# Patient Record
Sex: Male | Born: 1960 | Race: White | Hispanic: No | Marital: Married | Smoking: Never smoker
Health system: Southern US, Community
[De-identification: ages and names within clinical notes are randomized; demographics above are authoritative.]

## PROBLEM LIST (undated history)

## (undated) DIAGNOSIS — E785 Hyperlipidemia, unspecified: Secondary | ICD-10-CM

## (undated) DIAGNOSIS — M722 Plantar fascial fibromatosis: Secondary | ICD-10-CM

## (undated) DIAGNOSIS — R319 Hematuria, unspecified: Secondary | ICD-10-CM

## (undated) DIAGNOSIS — D509 Iron deficiency anemia, unspecified: Secondary | ICD-10-CM

## (undated) DIAGNOSIS — K219 Gastro-esophageal reflux disease without esophagitis: Secondary | ICD-10-CM

## (undated) DIAGNOSIS — K644 Residual hemorrhoidal skin tags: Secondary | ICD-10-CM

## (undated) DIAGNOSIS — M545 Low back pain, unspecified: Secondary | ICD-10-CM

## (undated) DIAGNOSIS — E119 Type 2 diabetes mellitus without complications: Secondary | ICD-10-CM

## (undated) DIAGNOSIS — G4730 Sleep apnea, unspecified: Secondary | ICD-10-CM

## (undated) DIAGNOSIS — R7989 Other specified abnormal findings of blood chemistry: Secondary | ICD-10-CM

## (undated) DIAGNOSIS — G629 Polyneuropathy, unspecified: Secondary | ICD-10-CM

## (undated) DIAGNOSIS — R0789 Other chest pain: Secondary | ICD-10-CM

## (undated) DIAGNOSIS — R5383 Other fatigue: Secondary | ICD-10-CM

## (undated) DIAGNOSIS — I77819 Aortic ectasia, unspecified site: Secondary | ICD-10-CM

## (undated) DIAGNOSIS — I10 Essential (primary) hypertension: Secondary | ICD-10-CM

## (undated) DIAGNOSIS — R748 Abnormal levels of other serum enzymes: Secondary | ICD-10-CM

## (undated) HISTORY — DX: Low back pain, unspecified: M54.50

## (undated) HISTORY — DX: Polyneuropathy, unspecified: G62.9

## (undated) HISTORY — DX: Plantar fascial fibromatosis: M72.2

## (undated) HISTORY — DX: Essential (primary) hypertension: I10

## (undated) HISTORY — DX: Hyperlipidemia, unspecified: E78.5

## (undated) HISTORY — DX: Other chest pain: R07.89

## (undated) HISTORY — DX: Residual hemorrhoidal skin tags: K64.4

## (undated) HISTORY — DX: Other specified abnormal findings of blood chemistry: R79.89

## (undated) HISTORY — DX: Abnormal levels of other serum enzymes: R74.8

## (undated) HISTORY — DX: Hematuria, unspecified: R31.9

## (undated) HISTORY — PX: WISDOM TOOTH EXTRACTION: SHX21

## (undated) HISTORY — DX: Type 2 diabetes mellitus without complications: E11.9

## (undated) HISTORY — DX: Aortic ectasia, unspecified site: I77.819

## (undated) HISTORY — DX: Low back pain: M54.5

## (undated) HISTORY — DX: Other fatigue: R53.83

## (undated) HISTORY — DX: Gastro-esophageal reflux disease without esophagitis: K21.9

## (undated) HISTORY — DX: Sleep apnea, unspecified: G47.30

## (undated) HISTORY — DX: Iron deficiency anemia, unspecified: D50.9

---

## 2000-05-13 ENCOUNTER — Encounter: Admission: RE | Admit: 2000-05-13 | Discharge: 2000-05-13 | Payer: Self-pay | Admitting: Internal Medicine

## 2000-05-13 ENCOUNTER — Encounter: Payer: Self-pay | Admitting: Internal Medicine

## 2001-06-09 ENCOUNTER — Ambulatory Visit (HOSPITAL_COMMUNITY): Admission: RE | Admit: 2001-06-09 | Discharge: 2001-06-09 | Payer: Self-pay | Admitting: Internal Medicine

## 2004-03-25 ENCOUNTER — Encounter: Admission: RE | Admit: 2004-03-25 | Discharge: 2004-03-25 | Payer: Self-pay | Admitting: Emergency Medicine

## 2004-07-16 ENCOUNTER — Encounter: Admission: RE | Admit: 2004-07-16 | Discharge: 2004-07-16 | Payer: Self-pay | Admitting: Emergency Medicine

## 2005-12-15 ENCOUNTER — Encounter: Admission: RE | Admit: 2005-12-15 | Discharge: 2005-12-15 | Payer: Self-pay | Admitting: Emergency Medicine

## 2015-02-20 ENCOUNTER — Encounter: Payer: Self-pay | Admitting: *Deleted

## 2015-07-02 ENCOUNTER — Institutional Professional Consult (permissible substitution): Payer: Federal, State, Local not specified - PPO | Admitting: Neurology

## 2015-07-15 ENCOUNTER — Ambulatory Visit (INDEPENDENT_AMBULATORY_CARE_PROVIDER_SITE_OTHER): Payer: Federal, State, Local not specified - PPO | Admitting: Neurology

## 2015-07-15 ENCOUNTER — Encounter (INDEPENDENT_AMBULATORY_CARE_PROVIDER_SITE_OTHER): Payer: Self-pay

## 2015-07-15 ENCOUNTER — Encounter: Payer: Self-pay | Admitting: Neurology

## 2015-07-15 VITALS — BP 142/88 | HR 88 | Resp 18 | Ht 72.0 in | Wt 192.0 lb

## 2015-07-15 DIAGNOSIS — E663 Overweight: Secondary | ICD-10-CM | POA: Diagnosis not present

## 2015-07-15 DIAGNOSIS — G4719 Other hypersomnia: Secondary | ICD-10-CM | POA: Diagnosis not present

## 2015-07-15 DIAGNOSIS — G2581 Restless legs syndrome: Secondary | ICD-10-CM | POA: Diagnosis not present

## 2015-07-15 DIAGNOSIS — R0683 Snoring: Secondary | ICD-10-CM | POA: Diagnosis not present

## 2015-07-15 NOTE — Patient Instructions (Signed)

## 2015-07-15 NOTE — Progress Notes (Signed)
Subjective:    Patient ID: Stephen Peck is a 54 y.o. male.  HPI     Huston Foley, MD, PhD [facility] [address] [address] [place], [place] [address]  Dear Dr. Felipa Eth,   I saw your patient, Stephen Peck, upon your kind request in my neurologic clinic today for initial consultation of his sleep disorder, in particular, concern for underlying obstructive sleep apnea. The patient is unaccompanied today. As you know, Stephen Peck is a 53 year old right-handed gentleman with an underlying medical history of hyperlipidemia, reflux disease, hypertension, atypical chest pain, low back pain, peripheral neuropathy and overweight state, who reports snoring, witnessed apneas per wife and excessive daytime somnolence. He has had infrequent choking sensations while asleep. His Epworth sleepiness score is 11 out of 24 today, his fatigue score is 30 out of 63.in the recent past, he has had some muscle aching and joint pains in particular knee pain bilaterally which was attributed to being on Crestor. About a month ago he was switched to low-dose pravastatin. Symptoms of aching and joint pains improved but he still has aching in both forearms close to the elbow region bilaterally. He denies any symptoms consistent with carpal tunnel syndrome. He denies morning headaches. He has occasional restless leg symptoms and has to move his legs at night. He is overall a fairly restless and light sleeper. Bedtime varies from 10 PM to midnight. He falls asleep fairly quickly. He has multiple nighttime awakenings but does not stay awake long. He has a rise time of 6:30 on weekdays and may sleep until 7:30 on weekends. He wakes up marginally to adequately rested. He has no significant nocturia. He is not aware of any family history of OSA. His weight has been fairly stable in the past 5-6 years. He lives at home with his wife and 2 children, ages 74 and 74. He works full-time as a  Surveyor, minerals for [employer]. He drinks alcohol socially, he does not smoke, he drinks caffeine in the form of coffee.  I reviewed your office note from 06/09/2015, which you kindly included.   His Past Medical History Is Significant For: Past Medical History  Diagnosis Date  . GERD (gastroesophageal reflux disease)   . Peripheral neuropathy (HCC)   . Hypertension   . Dyslipidemia   . Lumbar back pain   . Elevated liver enzymes   . Hyperlipemia   . Fatigue     His Past Surgical History Is Significant For: Past Surgical History  Procedure Laterality Date  . Wisdom tooth extraction      His Family History Is Significant For: Family History  Problem Relation Age of Onset  . Stroke Mother   . Hypertension Mother   . Cancer Father   . Cancer Maternal Grandmother   . Cancer Paternal Grandfather   . Stroke Paternal Grandmother   . Diabetes Brother   . Diabetes Brother     His Social History Is Significant For: Social History   Social History  . Marital Status: Married    Spouse Name: N/A  . Number of Children: 2  . Years of Education: College   Occupational History  . MBA [employer]    Social History Main Topics  . Smoking status: Never Smoker   . Smokeless tobacco: None  . Alcohol Use: 1.8 - 2.4 oz/week    3-4 Standard drinks or equivalent per week  . Drug Use: No  . Sexual Activity: Not Asked   Other  Topics Concern  . None   Social History Narrative   Drinks coffee daily     His Allergies Are:  No Known Allergies:   His Current Medications Are:  Outpatient Encounter Prescriptions as of 07/15/2015  Medication Sig  . omeprazole (PRILOSEC) 20 MG capsule Take 20 mg by mouth daily.  . pravastatin (PRAVACHOL) 20 MG tablet   . telmisartan (MICARDIS) 80 MG tablet Take 80 mg by mouth daily.   No facility-administered encounter medications on file as of 07/15/2015.  :  Review of Systems:  Out of a complete 14 point review of systems, all are reviewed and  negative with the exception of these symptoms as listed below:   Review of Systems  Musculoskeletal:       Joint pain, aching muscles   Neurological:       Patient reports that he wakes up frequently during the night, snoring, witnessed apnea, has awaken gasping for air when sitting in chair, restless legs, sometimes wakes up feeling tired, daytime tiredness after lunch, h/o migraines, denies taking naps.    Epworth Sleepiness Scale 0= would never doze 1= slight chance of dozing 2= moderate chance of dozing 3= high chance of dozing  Sitting and reading:2 Watching TV:2 Sitting inactive in a public place (ex. Theater or meeting):1 As a passenger in a car for an hour without a break:1 Lying down to rest in the afternoon:2 Sitting and talking to someone:1 Sitting quietly after lunch (no alcohol):2 In a car, while stopped in traffic:0 Total:11 Objective:  Neurologic Exam  Physical Exam Physical Examination:   Filed Vitals:   07/15/15 1437  BP: 142/88  Pulse: 88  Resp: 18   General Examination: The patient is a very pleasant 54 y.o. male in no acute distress. He appears well-developed and well-nourished and well groomed.   HEENT: Normocephalic, atraumatic, pupils are equal, round and reactive to light and accommodation. Funduscopic exam is normal with sharp disc margins noted. Extraocular tracking is good without limitation to gaze excursion or nystagmus noted. Normal smooth pursuit is noted. Hearing is grossly intact. Face is symmetric with normal facial animation and normal facial sensation. Speech is clear with no dysarthria noted. There is no hypophonia. There is no lip, neck/head, jaw or voice tremor. Neck is supple with full range of passive and active motion. There are no carotid bruits on auscultation. Oropharynx exam reveals: mild mouth dryness, good dental hygiene and mild airway crowding, due to thicker tongue, redundant soft palate. Tonsils are small, uvula is small.  Mallampati is class II. Tongue protrudes centrally and palate elevates symmetrically. Tonsils are 1+ in size. Neck size is 17 inches. He has a minimal overbite. Nasal inspection reveals no significant nasal mucosal bogginess or redness and no septal deviation.   Chest: Clear to auscultation without wheezing, rhonchi or crackles noted.  Heart: S1+S2+0, regular and normal without murmurs, rubs or gallops noted.   Abdomen: Soft, non-tender and non-distended with normal bowel sounds appreciated on auscultation.  Extremities: There is no pitting edema in the distal lower extremities bilaterally. Pedal pulses are intact.  Skin: Warm and dry without trophic changes noted. There are no varicose veins.  Musculoskeletal: exam reveals no obvious joint deformities, tenderness or joint swelling or erythema.   Neurologically:  Mental status: The patient is awake, alert and oriented in all 4 spheres. His immediate and remote memory, attention, language skills and fund of knowledge are appropriate. There is no evidence of aphasia, agnosia, apraxia or anomia. Speech is clear  with normal prosody and enunciation. Thought process is linear. Mood is normal and affect is normal.  Cranial nerves II - XII are as described above under HEENT exam. In addition: shoulder shrug is normal with equal shoulder height noted. Motor exam: Normal bulk, strength and tone is noted. There is no drift, tremor or rebound. Romberg is negative. Reflexes are 2+ throughout. Babinski: Toes are flexor bilaterally. Fine motor skills and coordination: intact with normal finger taps, normal hand movements, normal rapid alternating patting, normal foot taps and normal foot agility.  Cerebellar testing: No dysmetria or intention tremor on finger to nose testing. Heel to shin is unremarkable bilaterally. There is no truncal or gait ataxia.  Sensory exam: intact in the upper and lower extremities.  Gait, station and balance: He stands easily. No  veering to one side is noted. No leaning to one side is noted. Posture is age-appropriate and stance is narrow based. Gait shows normal stride length and normal pace. No problems turning are noted. He turns en bloc. Tandem walk is unremarkable.                Assessment and Plan:  In summary, Stephen Peck is a very pleasant 63 y.o.-year old male with an underlying medical history of hyperlipidemia, reflux disease, hypertension, atypical chest pain, low back pain, peripheral neuropathy and overweight state, whose history and physical exam are indeed concerning for obstructive sleep apnea (OSA). He also endorses mild restless leg symptoms. I had a long chat with the patient about my findings and the diagnosis of OSA, its prognosis and treatment options. We talked about medical treatments, surgical interventions and non-pharmacological approaches. I explained in particular the risks and ramifications of untreated moderate to severe OSA, especially with respect to developing cardiovascular disease down the Road, including congestive heart failure, difficult to treat hypertension, cardiac arrhythmias, or stroke. Even type 2 diabetes has, in part, been linked to untreated OSA. Symptoms of untreated OSA include daytime sleepiness, memory problems, mood irritability and mood disorder such as depression and anxiety, lack of energy, as well as recurrent headaches, especially morning headaches. We talked about trying to maintain a healthy lifestyle in general, as well as the importance of weight control. I encouraged the patient to eat healthy, exercise daily and keep well hydrated, to keep a scheduled bedtime and wake time routine, to not skip any meals and eat healthy snacks in between meals. I advised the patient not to drive when feeling sleepy. I recommended the following at this time: sleep study with potential positive airway pressure titration. (We will score hypopneas at 3% and split the sleep study into  diagnostic and treatment portion, if the estimated. 2 hour AHI is >15/h).   I explained the sleep test procedure to the patient and also outlined possible surgical and non-surgical treatment options of OSA, including the use of a custom-made dental device (which would require a referral to a specialist dentist or oral surgeon), upper airway surgical options, such as pillar implants, radiofrequency surgery, tongue base surgery, and UPPP (which would involve a referral to an ENT surgeon). Rarely, jaw surgery such as mandibular advancement may be considered.  I also explained the CPAP treatment option to the patient, who indicated that he would be reluctant to use CPAP but would be willing to try it if the need arises and he qualifies. I explained the importance of being compliant with PAP treatment, not only for insurance purposes but primarily to improve His symptoms, and for the  patient's long term health benefit, including to reduce His cardiovascular risks. I answered all his questions today and the patient was in agreement. I would like to see him back after the sleep study is completed and encouraged him to call with any interim questions, concerns, problems or updates.   Thank you very much for allowing me to participate in the care of this nice patient. If I can be of any further assistance to you please do not hesitate to call me at [phone].  Sincerely,   Huston Foley, MD, PhD

## 2016-01-06 DIAGNOSIS — K080 Exfoliation of teeth due to systemic causes: Secondary | ICD-10-CM | POA: Diagnosis not present

## 2016-01-30 ENCOUNTER — Ambulatory Visit (INDEPENDENT_AMBULATORY_CARE_PROVIDER_SITE_OTHER): Payer: Federal, State, Local not specified - PPO | Admitting: Neurology

## 2016-01-30 DIAGNOSIS — G4733 Obstructive sleep apnea (adult) (pediatric): Secondary | ICD-10-CM | POA: Diagnosis not present

## 2016-02-06 ENCOUNTER — Telehealth: Payer: Self-pay | Admitting: Neurology

## 2016-02-06 DIAGNOSIS — G4733 Obstructive sleep apnea (adult) (pediatric): Secondary | ICD-10-CM

## 2016-02-06 NOTE — Telephone Encounter (Signed)
Diana:  Patient referred by Dr. Felipa EthAvva, seen by me on 07/14/16, split study on 01/30/16, Ins: [employer]. Please call and notify patient that the recent sleep study confirmed the diagnosis of moderate OSA. He did well with CPAP during the study with significant improvement of the respiratory events. Therefore, I would like start the patient on CPAP therapy at home by prescribing a machine for home use. I placed the order in the chart. The patient will need a follow up appointment with me in 8 to 10 weeks post set up that has to be scheduled; please go ahead and schedule while you have the patient on the phone and make sure patient understands the importance of keeping this window for the FU appointment, as it is often an insurance requirement and failing to adhere to this may result in losing coverage for sleep apnea treatment.  Please re-enforce the importance of compliance with treatment and the need for us to monitor compliance data - again an insurance requirement and good feedback for the patient as far as how they are doing.  Also remind patient, that any upcoming CPAP machine or mask issues, should be first addressed with the DME company. Please ask if patient has a preference regarding DME company.  Please arrange for CPAP set up at home through a DME company of patient's choice - once you have spoken to the patient - and faxed/routed report to PCP and referring MD (if other than PCP), you can close this encounter, thanks,   Huston FoleySaima Tanaysia Bhardwaj, MD, PhD [facility] ([facility]

## 2016-02-09 NOTE — Telephone Encounter (Signed)
I spoke to patient, he is aware of results and recommendations. He is not apposed to CPAP treatment. He wants to know with his degree of OSA, is there other options for him i.e weight loss, dental device etc. Patient is aware that I will call him back tomorrow with further.

## 2016-02-10 NOTE — Telephone Encounter (Addendum)
Pls call him back: I would like to encourage him to try CPAP for a few months at least. He is not obese, so weight loss may help, but may not eliminate his OSA. Oral appliance may be an option down the road. Let's discuss when he comes back for his 1st cpap FU.

## 2016-02-10 NOTE — Telephone Encounter (Signed)
I spoke to patient. He would like to talk it over with his wife and call us back in a couple of days. I also offered the patient a f/u appt to discuss study and treatment options.

## 2016-02-11 DIAGNOSIS — L821 Other seborrheic keratosis: Secondary | ICD-10-CM | POA: Diagnosis not present

## 2016-02-11 DIAGNOSIS — L281 Prurigo nodularis: Secondary | ICD-10-CM | POA: Diagnosis not present

## 2016-02-11 DIAGNOSIS — L739 Follicular disorder, unspecified: Secondary | ICD-10-CM | POA: Diagnosis not present

## 2016-02-11 DIAGNOSIS — D1801 Hemangioma of skin and subcutaneous tissue: Secondary | ICD-10-CM | POA: Diagnosis not present

## 2016-03-10 NOTE — Telephone Encounter (Signed)
I spoke to patient and advised him that we do not have any financial information since we do not sell CPAP machine. I gave him the contact information to [facility] and advised that she may be able to answer some of his questions. Since he was driving he asked for me to call back and leave information on vm. I called back and did so.

## 2016-03-10 NOTE — Telephone Encounter (Signed)
Patient is calling back to discuss options for sleep apparatuses and the cost. Please call and discuss.

## 2016-03-31 DIAGNOSIS — G4733 Obstructive sleep apnea (adult) (pediatric): Secondary | ICD-10-CM | POA: Diagnosis not present

## 2016-06-03 DIAGNOSIS — E784 Other hyperlipidemia: Secondary | ICD-10-CM | POA: Diagnosis not present

## 2016-06-03 DIAGNOSIS — I10 Essential (primary) hypertension: Secondary | ICD-10-CM | POA: Diagnosis not present

## 2016-06-03 DIAGNOSIS — Z125 Encounter for screening for malignant neoplasm of prostate: Secondary | ICD-10-CM | POA: Diagnosis not present

## 2016-06-09 DIAGNOSIS — E784 Other hyperlipidemia: Secondary | ICD-10-CM | POA: Diagnosis not present

## 2016-06-09 DIAGNOSIS — I10 Essential (primary) hypertension: Secondary | ICD-10-CM | POA: Diagnosis not present

## 2016-06-09 DIAGNOSIS — Z23 Encounter for immunization: Secondary | ICD-10-CM | POA: Diagnosis not present

## 2016-06-09 DIAGNOSIS — G4733 Obstructive sleep apnea (adult) (pediatric): Secondary | ICD-10-CM | POA: Diagnosis not present

## 2016-06-09 DIAGNOSIS — Z1389 Encounter for screening for other disorder: Secondary | ICD-10-CM | POA: Diagnosis not present

## 2016-06-09 DIAGNOSIS — K219 Gastro-esophageal reflux disease without esophagitis: Secondary | ICD-10-CM | POA: Diagnosis not present

## 2016-06-09 DIAGNOSIS — Z0000 Encounter for general adult medical examination without abnormal findings: Secondary | ICD-10-CM | POA: Diagnosis not present

## 2016-06-17 DIAGNOSIS — Z1212 Encounter for screening for malignant neoplasm of rectum: Secondary | ICD-10-CM | POA: Diagnosis not present

## 2016-06-17 DIAGNOSIS — K625 Hemorrhage of anus and rectum: Secondary | ICD-10-CM | POA: Diagnosis not present

## 2016-07-13 DIAGNOSIS — K080 Exfoliation of teeth due to systemic causes: Secondary | ICD-10-CM | POA: Diagnosis not present

## 2016-08-04 DIAGNOSIS — K648 Other hemorrhoids: Secondary | ICD-10-CM | POA: Diagnosis not present

## 2016-08-04 DIAGNOSIS — K644 Residual hemorrhoidal skin tags: Secondary | ICD-10-CM | POA: Diagnosis not present

## 2016-08-04 DIAGNOSIS — K625 Hemorrhage of anus and rectum: Secondary | ICD-10-CM | POA: Diagnosis not present

## 2017-01-12 DIAGNOSIS — K080 Exfoliation of teeth due to systemic causes: Secondary | ICD-10-CM | POA: Diagnosis not present

## 2017-05-10 DIAGNOSIS — B351 Tinea unguium: Secondary | ICD-10-CM | POA: Diagnosis not present

## 2017-05-10 DIAGNOSIS — Z6827 Body mass index (BMI) 27.0-27.9, adult: Secondary | ICD-10-CM | POA: Diagnosis not present

## 2017-05-10 DIAGNOSIS — B354 Tinea corporis: Secondary | ICD-10-CM | POA: Diagnosis not present

## 2017-05-10 DIAGNOSIS — Z23 Encounter for immunization: Secondary | ICD-10-CM | POA: Diagnosis not present

## 2017-05-20 ENCOUNTER — Ambulatory Visit (INDEPENDENT_AMBULATORY_CARE_PROVIDER_SITE_OTHER): Payer: Federal, State, Local not specified - PPO | Admitting: Podiatry

## 2017-05-20 ENCOUNTER — Encounter: Payer: Self-pay | Admitting: Podiatry

## 2017-05-20 VITALS — BP 143/99 | HR 76 | Resp 16

## 2017-05-20 DIAGNOSIS — B351 Tinea unguium: Secondary | ICD-10-CM

## 2017-05-20 MED ORDER — FLUCONAZOLE 150 MG PO TABS: ORAL_TABLET | ORAL | 0 refills | Status: DC

## 2017-05-20 NOTE — Progress Notes (Signed)
   Subjective:    Patient ID: Stephen Peck, male    DOB: 03/22/1961, 56 y.o.   MRN: [id number]  HPI    Review of Systems  All other systems reviewed and are negative.      Objective:   Physical Exam        Assessment & Plan:

## 2017-05-20 NOTE — Progress Notes (Signed)
Subjective:    Patient ID: Stephen Peck, male   DOB: 56 y.o.   MRN: [id number]   HPI patient presents with approximate 6 month history of yellow discoloration left hallux nail with significant family history of this condition. Patient states he's been trying topical medicine and he does not smoke    Review of Systems  All other systems reviewed and are negative.       Objective:  Physical Exam  Constitutional: He appears well-developed and well-nourished.  Cardiovascular: Intact distal pulses.   Musculoskeletal: Normal range of motion.  Neurological: He is alert.  Skin: Skin is warm.  Nursing note and vitals reviewed.  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 Reviewed options and will initiate with Diflucan for 12 weeks in conjunction with laser and topical. Patient will be scheduled for laser therapy that he'll initiate a more have multi-treatments for 3 or 4 months

## 2017-05-27 ENCOUNTER — Ambulatory Visit: Payer: Self-pay | Admitting: Podiatry

## 2017-05-27 DIAGNOSIS — B351 Tinea unguium: Secondary | ICD-10-CM

## 2017-05-27 NOTE — Progress Notes (Signed)
Pt presents with mycotic infection of nails hallux only  All other systems are negative  Laser therapy administered to affected nails and tolerated well. All safety precautions were in place. Re-appointed in 4 weeks for 2nd treatment

## 2017-06-22 DIAGNOSIS — Z125 Encounter for screening for malignant neoplasm of prostate: Secondary | ICD-10-CM | POA: Diagnosis not present

## 2017-06-22 DIAGNOSIS — Z0000 Encounter for general adult medical examination without abnormal findings: Secondary | ICD-10-CM | POA: Diagnosis not present

## 2017-06-22 DIAGNOSIS — I10 Essential (primary) hypertension: Secondary | ICD-10-CM | POA: Diagnosis not present

## 2017-06-24 ENCOUNTER — Ambulatory Visit: Payer: Self-pay

## 2017-06-24 DIAGNOSIS — B351 Tinea unguium: Secondary | ICD-10-CM

## 2017-06-27 NOTE — Progress Notes (Signed)
Pt presents with mycotic infection of nails hallux only  All other systems are negative  Laser therapy administered to affected nails and tolerated well. All safety precautions were in place. Re-appointed in 4 weeks for 3rd treatment

## 2017-06-29 DIAGNOSIS — Z0000 Encounter for general adult medical examination without abnormal findings: Secondary | ICD-10-CM | POA: Diagnosis not present

## 2017-06-29 DIAGNOSIS — B351 Tinea unguium: Secondary | ICD-10-CM | POA: Diagnosis not present

## 2017-06-29 DIAGNOSIS — E7849 Other hyperlipidemia: Secondary | ICD-10-CM | POA: Diagnosis not present

## 2017-06-29 DIAGNOSIS — R718 Other abnormality of red blood cells: Secondary | ICD-10-CM | POA: Diagnosis not present

## 2017-06-29 DIAGNOSIS — Z1389 Encounter for screening for other disorder: Secondary | ICD-10-CM | POA: Diagnosis not present

## 2017-06-29 DIAGNOSIS — K219 Gastro-esophageal reflux disease without esophagitis: Secondary | ICD-10-CM | POA: Diagnosis not present

## 2017-07-12 DIAGNOSIS — Z1212 Encounter for screening for malignant neoplasm of rectum: Secondary | ICD-10-CM | POA: Diagnosis not present

## 2017-07-19 DIAGNOSIS — K080 Exfoliation of teeth due to systemic causes: Secondary | ICD-10-CM | POA: Diagnosis not present

## 2017-07-22 ENCOUNTER — Ambulatory Visit: Payer: Self-pay

## 2017-07-22 DIAGNOSIS — B351 Tinea unguium: Secondary | ICD-10-CM

## 2017-08-04 NOTE — Progress Notes (Signed)
Pt presents with mycotic infection of nails hallux only  All other systems are negative  Laser therapy administered to affected nails and tolerated well. All safety precautions were in place. Re-appointed in 4 weeks for 4th treatment

## 2017-08-18 ENCOUNTER — Ambulatory Visit: Payer: Self-pay | Admitting: Surgery

## 2017-08-18 DIAGNOSIS — K642 Third degree hemorrhoids: Secondary | ICD-10-CM | POA: Diagnosis not present

## 2017-08-18 DIAGNOSIS — K641 Second degree hemorrhoids: Secondary | ICD-10-CM | POA: Diagnosis not present

## 2017-08-18 DIAGNOSIS — K644 Residual hemorrhoidal skin tags: Secondary | ICD-10-CM | POA: Diagnosis not present

## 2017-08-31 DIAGNOSIS — K644 Residual hemorrhoidal skin tags: Secondary | ICD-10-CM | POA: Diagnosis not present

## 2017-08-31 DIAGNOSIS — I10 Essential (primary) hypertension: Secondary | ICD-10-CM | POA: Diagnosis not present

## 2017-08-31 DIAGNOSIS — R718 Other abnormality of red blood cells: Secondary | ICD-10-CM | POA: Diagnosis not present

## 2017-08-31 DIAGNOSIS — Z6827 Body mass index (BMI) 27.0-27.9, adult: Secondary | ICD-10-CM | POA: Diagnosis not present

## 2018-01-17 DIAGNOSIS — K080 Exfoliation of teeth due to systemic causes: Secondary | ICD-10-CM | POA: Diagnosis not present

## 2018-03-14 DIAGNOSIS — K080 Exfoliation of teeth due to systemic causes: Secondary | ICD-10-CM | POA: Diagnosis not present

## 2018-03-20 DIAGNOSIS — D225 Melanocytic nevi of trunk: Secondary | ICD-10-CM | POA: Diagnosis not present

## 2018-03-20 DIAGNOSIS — L821 Other seborrheic keratosis: Secondary | ICD-10-CM | POA: Diagnosis not present

## 2018-03-20 DIAGNOSIS — L814 Other melanin hyperpigmentation: Secondary | ICD-10-CM | POA: Diagnosis not present

## 2018-03-20 DIAGNOSIS — D1801 Hemangioma of skin and subcutaneous tissue: Secondary | ICD-10-CM | POA: Diagnosis not present

## 2018-03-27 ENCOUNTER — Ambulatory Visit: Payer: Federal, State, Local not specified - PPO | Admitting: Neurology

## 2018-03-27 ENCOUNTER — Encounter: Payer: Self-pay | Admitting: Neurology

## 2018-03-27 VITALS — BP 154/100 | HR 88 | Ht 72.0 in | Wt 205.0 lb

## 2018-03-27 DIAGNOSIS — G4763 Sleep related bruxism: Secondary | ICD-10-CM | POA: Diagnosis not present

## 2018-03-27 DIAGNOSIS — G4733 Obstructive sleep apnea (adult) (pediatric): Secondary | ICD-10-CM

## 2018-03-27 DIAGNOSIS — R635 Abnormal weight gain: Secondary | ICD-10-CM

## 2018-03-27 NOTE — Patient Instructions (Addendum)
We will set you up at home with a so called autoPAP machine for treatment of your obstructive sleep apnea.  Let's have you follow up in 3 months, we will try to get records from Dr. Alveda ReasonsFuller's office.  Please use your autoPAP regularly. While your insurance requires that you use PAP at least 4 hours each night on 70% of the nights, I recommend, that you not skip any nights and use it throughout the night if you can. Getting used to PAP and staying with the treatment long term does take time and patience and discipline. Untreated obstructive sleep apnea when it is moderate to severe can have an adverse impact on cardiovascular health and raise her risk for heart disease, arrhythmias, hypertension, congestive heart failure, stroke and diabetes. Untreated obstructive sleep apnea causes sleep disruption, nonrestorative sleep, and sleep deprivation. This can have an impact on your day to day functioning and cause daytime sleepiness and impairment of cognitive function, memory loss, mood disturbance, and problems focussing. Using PAP regularly can improve these symptoms. The address for [facility] is: [phone].

## 2018-03-27 NOTE — Progress Notes (Signed)
Subjective:    Patient ID: Stephen Peck is a 57 y.o. male.  HPI     Interim history:   Stephen Peck is a 57 year old right-handed gentleman with an underlying medical history of hyperlipidemia, reflux disease, hypertension, atypical chest pain, low back pain, peripheral neuropathy and overweight state, who presents for re-evaluation of his obstructive sleep apnea. The patient is unaccompanied today. I first met him on 07/15/2015 at the request of his primary care physician, at which time he reported snoring and witnessed apneas as well as excessive daytime somnolence with an Epworth score of 11. His BMI was 26 at the time. He was advised to proceed with sleep study. He had a a split sleep study on 01/30/2016. His baseline AHI was 16.2 per hour, O2 nadir was 77%. He was titrated on CPAP up to 10 cm, is AHI was 0 per hour on the pressure of 10 cm. The patient did not pursue CPAP therapy at the time. Today, 03/27/2018: He reports that pursuit treatment of his sleep apnea with an oral appliance to Dr. Corky Sing office. He had done well for a year and a half with it. 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 He had a new oral appliance made and most recently the top part broke. He's using the bottom part primarily for a bite guard currently. Overall, the oral appliance had helped. He has gained a little bit of weight compared to 2 years ago. Generally, he is feeling that his sleep has become worse in terms of sleep consolidation is less good. He does not have night to night nocturia recurrent morning headaches but has multiple nighttime awakenings and occasional restless leg symptoms and his wife has reported to him that he tends to move his feet while asleep. His bedtime is generally around 11 PM and rise time during the week around 6:30. He drinks coffee in the morning, one large and very occasional soda for lunch. He has significant tooth grinding at night. He is a very restless  sleeper he believes and has a lot of tossing and turning on most nights. He has had no medication changes in the past couple of years.  Previously (copied from previous notes for reference):   07/15/2015: (He) reports snoring, witnessed apneas per wife and excessive daytime somnolence. He has had infrequent choking sensations while asleep. His Epworth sleepiness score is 11 out of 24 today, his fatigue score is 30 out of 63.in the recent past, he has had some muscle aching and joint pains in particular knee pain bilaterally which was attributed to being on Crestor. About a month ago he was switched to low-dose pravastatin. Symptoms of aching and joint pains improved but he still has aching in both forearms close to the elbow region bilaterally. He denies any symptoms consistent with carpal tunnel syndrome. He denies morning headaches. He has occasional restless leg symptoms and has to move his legs at night. He is overall a fairly restless and light sleeper. Bedtime varies from 10 PM to midnight. He falls asleep fairly quickly. He has multiple nighttime awakenings but does not stay awake long. He has a rise time of 6:30 on weekdays and may sleep until 7:30 on weekends. He wakes up marginally to adequately rested. He has no significant nocturia. He is not aware of any family history of OSA. His weight has been fairly stable in the past 5-6 years. He lives at home with his wife and 2 children, ages 15  and 12. 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 He drinks alcohol socially, he does not smoke, he drinks caffeine in the form of coffee.   His Past Medical History Is Significant For: Past Medical History:  Diagnosis Date  . Dyslipidemia   . Elevated liver enzymes   . Fatigue   . GERD (gastroesophageal reflux disease)   . Hyperlipemia   . Hypertension   . Lumbar back pain   . Peripheral neuropathy     His Past Surgical History Is Significant For: Past Surgical History:  Procedure  Laterality Date  . WISDOM TOOTH EXTRACTION      His Family History Is Significant For: Family History  Problem Relation Age of Onset  . Stroke Mother   . Hypertension Mother   . Cancer Father   . Cancer Maternal Grandmother   . Cancer Paternal Grandfather   . Stroke Paternal Grandmother   . Diabetes Brother   . Diabetes Brother     His Social History Is Significant For: Social History   Socioeconomic History  . Marital status: Married    Spouse name: Not on file  . Number of children: 2  . Years of education: College  . Highest education level: Not on file  Occupational History  . Occupation: MBA [employer]  Social Needs  . Financial resource strain: Not on file  . Food insecurity:    Worry: Not on file    Inability: Not on file  . Transportation needs:    Medical: Not on file    Non-medical: Not on file  Tobacco Use  . Smoking status: Never Smoker  . Smokeless tobacco: Former Network engineer and Sexual Activity  . Alcohol use: Yes    Alcohol/week: 3.0 - 4.0 standard drinks    Types: 3 - 4 Standard drinks or equivalent per week  . Drug use: No  . Sexual activity: Not on file  Lifestyle  . Physical activity:    Days per week: Not on file    Minutes per session: Not on file  . Stress: Not on file  Relationships  . Social connections:    Talks on phone: Not on file    Gets together: Not on file    Attends religious service: Not on file    Active member of club or organization: Not on file    Attends meetings of clubs or organizations: Not on file    Relationship status: Not on file  Other Topics Concern  . Not on file  Social History Narrative   Drinks coffee daily     His Allergies Are:  No Known Allergies:   His Current Medications Are:  Outpatient Encounter Medications as of 03/27/2018  Medication Sig  . omeprazole (PRILOSEC) 20 MG capsule Take 20 mg by mouth daily.  . pravastatin (PRAVACHOL) 20 MG tablet   . telmisartan (MICARDIS) 80 MG tablet Take 80  mg by mouth daily.  . [DISCONTINUED] fluconazole (DIFLUCAN) 150 MG tablet One pill per week   No facility-administered encounter medications on file as of 03/27/2018.   :  Review of Systems:  Out of a complete 14 point review of systems, all are reviewed and negative with the exception of these symptoms as listed below: Review of Systems  Neurological:       Pt presents today to discuss starting a cpap. Pt has been using a dental device, made by Dr. Toy Cookey, which worked great for the past 1.5 years, but since January pt  has been experiencing jaw pain.    Objective:  Neurological Exam  Physical Exam.  Physical Examination:   Vitals:   03/27/18 0820  BP: (!) 154/100  Pulse: 88    General Examination: The patient is a very pleasant 57 y.o. male in no acute distress. He appears well-developed and well-nourished and well groomed.   HEENT: Normocephalic, atraumatic, pupils are equal, round and reactive to light and accommodation. Extraocular tracking is good without limitation to gaze excursion or nystagmus noted. Normal smooth pursuit is noted. Hearing is grossly intact. Face is symmetric with normal facial animation and normal facial sensation. Speech is clear with no dysarthria noted. There is no hypophonia. There is no lip, neck/head, jaw or voice tremor. Neck shows FROM. Oropharynx exam reveals: mild to moderate mouth dryness, good dental hygiene and mild airway crowding, due to thicker tongue, redundant soft palate. Tonsils are small, mallampati is class II. Tongue protrudes centrally and palate elevates symmetrically. Tonsils are 1+ in size. Neck size is 17.75 inches. He has a mild overbite.   Chest: Clear to auscultation without wheezing, rhonchi or crackles noted.  Heart: S1+S2+0, regular and normal without murmurs, rubs or gallops noted.   Abdomen: Soft, non-tender and non-distended with normal bowel sounds appreciated on auscultation.  Extremities: There is no obvious  edema.   Skin: Warm and dry without trophic changes noted. There are no varicose veins.  Musculoskeletal: exam reveals no obvious joint deformities, tenderness or joint swelling or erythema.   Neurologically:  Mental status: The patient is awake, alert and oriented in all 4 spheres. His immediate and remote memory, attention, language skills and fund of knowledge are appropriate. There is no evidence of aphasia, agnosia, apraxia or anomia. Speech is clear with normal prosody and enunciation. Thought process is linear. Mood is normal and affect is normal.  Cranial nerves II - XII are as described above under HEENT exam.  Motor exam: Normal bulk, strength and tone is noted. There is no tremor. Fine motor skills and coordination: grossly intact.  Cerebellar testing: No dysmetria or intention tremor.  Sensory exam: intact in the upper and lower extremities.  Gait, station and balance: He stands easily. No veering to one side is noted. No leaning to one side is noted. Posture is age-appropriate and stance is narrow based. Gait shows normal stride length and normal pace. No problems turning are noted.                Assessment and Plan:  In summary, Stephen Peck is a very pleasant 57 year old male with an underlying medical history of hyperlipidemia, reflux disease, hypertension, and overweight state, who presents for follow-up consultation of his obstructive sleep apnea which was deemed to be in the moderate range during his split-night sleep study in June 2017. He has gained a little bit of weight in the realm of 10 pounds in the interim. He was treated with an oral appliance in the past couple of years, fairly successfully in the past 18 months but some 6 months ago he did start having jaw pain particularly on the right side. He does have a history of bruxism as well. His oral appliance broke and he is open to trying positive airway pressure. I would suggest a trial of AutoPap based on his  sleep study results from 2 years ago, we can also refer back to home sleep test results in the interim that were done through his dentist's office. We will request those records. I talked  to him about his sleep study results in detail. He does endorse some mild restless leg symptoms which may improve when he is on an AutoPap machine and successful with treatment. We will wait things out. He is advised to follow up routinely in 3 months, sooner if needed. He should hear from his DME company shortly. I answered all his questions today and he was in agreement with the plan.   I spent 25 minutes in total face-to-face time with the patient, more than 50% of which was spent in counseling and coordination of care, reviewing test results, reviewing medication and discussing or reviewing the diagnosis of OSA, its prognosis and treatment options. Pertinent laboratory and imaging test results that were available during this visit with the patient were reviewed by me and considered in my medical decision making (see chart for details).

## 2018-04-10 ENCOUNTER — Telehealth: Payer: Self-pay | Admitting: *Deleted

## 2018-04-10 NOTE — Telephone Encounter (Signed)
I received records form Dr.Fuller's office. [place]  [place] for review.

## 2018-04-11 DIAGNOSIS — G4733 Obstructive sleep apnea (adult) (pediatric): Secondary | ICD-10-CM | POA: Diagnosis not present

## 2018-05-12 DIAGNOSIS — G4733 Obstructive sleep apnea (adult) (pediatric): Secondary | ICD-10-CM | POA: Diagnosis not present

## 2018-05-19 DIAGNOSIS — G4733 Obstructive sleep apnea (adult) (pediatric): Secondary | ICD-10-CM | POA: Diagnosis not present

## 2018-06-11 DIAGNOSIS — G4733 Obstructive sleep apnea (adult) (pediatric): Secondary | ICD-10-CM | POA: Diagnosis not present

## 2018-06-14 DIAGNOSIS — L218 Other seborrheic dermatitis: Secondary | ICD-10-CM | POA: Diagnosis not present

## 2018-06-14 DIAGNOSIS — C44729 Squamous cell carcinoma of skin of left lower limb, including hip: Secondary | ICD-10-CM | POA: Diagnosis not present

## 2018-06-28 ENCOUNTER — Encounter: Payer: Self-pay | Admitting: Neurology

## 2018-07-03 ENCOUNTER — Ambulatory Visit: Payer: Federal, State, Local not specified - PPO | Admitting: Neurology

## 2018-07-03 ENCOUNTER — Encounter: Payer: Self-pay | Admitting: Neurology

## 2018-07-03 VITALS — BP 130/80 | HR 76 | Ht 72.0 in | Wt 206.0 lb

## 2018-07-03 DIAGNOSIS — G4733 Obstructive sleep apnea (adult) (pediatric): Secondary | ICD-10-CM

## 2018-07-03 DIAGNOSIS — Z9989 Dependence on other enabling machines and devices: Secondary | ICD-10-CM

## 2018-07-03 NOTE — Progress Notes (Signed)
Subjective:    Patient ID: Stephen Peck is a 57 y.o. male.  HPI     Interim history:   Stephen Peck is a 80 year old right-handed gentleman with an underlying medical history of hyperlipidemia, reflux disease, hypertension, atypical chest pain, low back pain, peripheral neuropathy and overweight state, who presents for follow-up consultation of his obstructive sleep apnea. I last saw him on 03/27/2018, at which time he reported problems with his oral appliance. He had been using an oral appliance fairly successfully for about 2 years but had increase in snoring and sleep-related symptoms including nighttime awakenings and occasional morning headaches. He was more restless. I suggested we proceed with AutoPap therapy.  I was able to review Dr. Corky Sing records in the interim home sleep testing from 05/12/2016 showed an AHI of 19.7 per hour, O2 nadir of 82%. He had several posttreatment home sleep test, including 05/13/2016, 07/14/2016, 12/23/2016, at which time his AHI was 18.8 per hour, 01/31/2017, which showed an AHI of 23.1 per hour and most recently on 08/22/2017, at which time his AHI was 18 per hour, O2 nadir of 82%. Clinic notes reviewed till 11/17/17. 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  Today, 07/03/2018: I reviewed his AutoPap compliance data from 05/30/2018 through 06/28/2018, which is a total of 30 days, during which time he used his AutoPap every night with percent used days greater than 4 hours at 100%, indicating superb compliance with an average usage of 7 hours and 18 minutes which is very good, residual AHI at goal but on the higher end at 4.4 per hour, 90th percentile of pressure at 9.1 cm with a range of 7 cm to 13 cm. Leak very low. He reports doing well. He actually feels better rested, he is motivated to continue with treatment. He is tracking his progress also on the cell phone app. He is using a nasal cradle interface, tried the nasal pillows  but did not like them as much. He is pleased with his outcome so far. He uses the lower part of his previous oral appliance as a bite guard, he actually broke the top part accidentally.  The patient's allergies, current medications, family history, past medical history, past social history, past surgical history and problem list were reviewed and updated as appropriate.   Previously (copied from previous notes for reference):   I first met him on 07/15/2015 at the request of his primary care physician, at which time he reported snoring and witnessed apneas as well as excessive daytime somnolence with an Epworth score of 11. His BMI was 26 at the time. He was advised to proceed with sleep study. He had a a split sleep study on 01/30/2016. His baseline AHI was 16.2 per hour, O2 nadir was 77%. He was titrated on CPAP up to 10 cm, is AHI was 0 per hour on the pressure of 10 cm. The patient did not pursue CPAP therapy at the time.   07/15/2015: (He) reports snoring, witnessed apneas per wife and excessive daytime somnolence. He has had infrequent choking sensations while asleep. His Epworth sleepiness score is 11 out of 24 today, his fatigue score is 30 out of 63.in the recent past, he has had some muscle aching and joint pains in particular knee pain bilaterally which was attributed to being on Crestor. About a month ago he was switched to low-dose pravastatin. Symptoms of aching and joint pains improved but he still has aching in both forearms close  to the elbow region bilaterally. He denies any symptoms consistent with carpal tunnel syndrome. He denies morning headaches. He has occasional restless leg symptoms and has to move his legs at night. He is overall a fairly restless and light sleeper. Bedtime varies from 10 PM to midnight. He falls asleep fairly quickly. He has multiple nighttime awakenings but does not stay awake long. He has a rise time of 6:30 on weekdays and may sleep until 7:30 on weekends. He  wakes up marginally to adequately rested. He has no significant nocturia. He is not aware of any family history of OSA. His weight has been fairly stable in the past 5-6 years. He lives at home with his wife and 2 children, ages 37 and 24. 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 He drinks alcohol socially, he does not smoke, he drinks caffeine in the form of coffee.   His Past Medical History Is Significant For: Past Medical History:  Diagnosis Date  . Dyslipidemia   . Elevated liver enzymes   . Fatigue   . GERD (gastroesophageal reflux disease)   . Hyperlipemia   . Hypertension   . Lumbar back pain   . Peripheral neuropathy     His Past Surgical History Is Significant For: Past Surgical History:  Procedure Laterality Date  . WISDOM TOOTH EXTRACTION      His Family History Is Significant For: Family History  Problem Relation Age of Onset  . Stroke Mother   . Hypertension Mother   . Cancer Father   . Cancer Maternal Grandmother   . Cancer Paternal Grandfather   . Stroke Paternal Grandmother   . Diabetes Brother   . Diabetes Brother     His Social History Is Significant For: Social History   Socioeconomic History  . Marital status: Married    Spouse name: Not on file  . Number of children: 2  . Years of education: College  . Highest education level: Not on file  Occupational History  . Occupation: MBA [employer]  Social Needs  . Financial resource strain: Not on file  . Food insecurity:    Worry: Not on file    Inability: Not on file  . Transportation needs:    Medical: Not on file    Non-medical: Not on file  Tobacco Use  . Smoking status: Never Smoker  . Smokeless tobacco: Former Network engineer and Sexual Activity  . Alcohol use: Yes    Alcohol/week: 3.0 - 4.0 standard drinks    Types: 3 - 4 Standard drinks or equivalent per week  . Drug use: No  . Sexual activity: Not on file  Lifestyle  . Physical activity:    Days per week: Not on file     Minutes per session: Not on file  . Stress: Not on file  Relationships  . Social connections:    Talks on phone: Not on file    Gets together: Not on file    Attends religious service: Not on file    Active member of club or organization: Not on file    Attends meetings of clubs or organizations: Not on file    Relationship status: Not on file  Other Topics Concern  . Not on file  Social History Narrative   Drinks coffee daily     His Allergies Are:  No Known Allergies:   His Current Medications Are:  Outpatient Encounter Medications as of 07/03/2018  Medication Sig  . omeprazole (PRILOSEC) 20  MG capsule Take 20 mg by mouth daily.  . pravastatin (PRAVACHOL) 20 MG tablet   . telmisartan (MICARDIS) 80 MG tablet Take 80 mg by mouth daily.   No facility-administered encounter medications on file as of 07/03/2018.    Review of Systems:  Out of a complete 14 point review of systems, all are reviewed and negative with the exception of these symptoms as listed below:  Review of Systems  Neurological:       Pt presents today to discuss his cpap. Pt reports that his cpap is going well.    Objective:  Neurological Exam  Physical Exam  Physical Examination:   Vitals:   07/03/18 0833  BP: 130/80  Pulse: 76    General Examination: The patient is a very pleasant 57 y.o. male in no acute distress. He appears well-developed and well-nourished and well groomed.   HEENT:Normocephalic, atraumatic, pupils are equal, round and reactive to light and accommodation. Extraocular tracking is good without limitation to gaze excursion or nystagmus noted. Normal smooth pursuit is noted. Hearing is grossly intact. Face is symmetric with normal facial animation and normal facial sensation. Speech is clear with no dysarthria noted. There is no hypophonia. There is no lip, neck/head, jaw or voice tremor. Neck shows FROM. Oropharynx exam reveals: mild mouth dryness, good dental hygiene and mild  airway crowding. Tongue protrudes centrally and palate elevates symmetrically.   Chest:Clear to auscultation without wheezing, rhonchi or crackles noted.  Heart:S1+S2+0, regular and normal without murmurs, rubs or gallops noted.   Abdomen:Soft, non-tender and non-distended with normal bowel sounds appreciated on auscultation.  Extremities:There is no pitting edema.   Skin: Warm and dry without trophic changes noted. There are no varicose veins.  Musculoskeletal: exam reveals no obvious joint deformities, tenderness or joint swelling or erythema.   Neurologically:  Mental status: The patient is awake, alert and oriented in all 4 spheres. His immediate and remote memory, attention, language skills and fund of knowledge are appropriate. There is no evidence of aphasia, agnosia, apraxia or anomia. Speech is clear with normal prosody and enunciation. Thought process is linear. Mood is normaland affect is normal.  Cranial nerves II - XII are as described above under HEENT exam.  Motor exam: Normal bulk, strength and tone is noted. There is no tremor. Fine motor skills and coordination: grossly intact.  Cerebellar testing: No dysmetria or intention tremor.  Sensory exam: intact in the upper and lower extremities.  Gait, station and balance: He stands easily. No veering to one side is noted. No leaning to one side is noted. Posture is age-appropriate and stance is narrow based. Gait shows normalstride length and normalpace. No problems turning are noted. Normal tandem walk.   Assessment and Plan:  In summary, BUNNY LOWDERMILK a very pleasant 57 year old male with an underlying medical history of hyperlipidemia, reflux disease, hypertension, and overweight state, who presents for follow-up consultation of his obstructive sleep apnea which was deemed to be in the moderate range during his split-night sleep study in June 2017. He has tried an oral appliance in the interim  for the past couple of years but had developed issues starting in January 2019. By April 2019 he was no longer able to use his oral appliance. He started using AutoPap in or around August 2019, he is fully compliant with treatment and his apnea score is at goal, his leak is very low, he uses a nasal cradle interphase from [employer], he is tracking his progress on the  cell phone up as well. He's doing well and reports improved sleep quality, sleep consolidation or less daytime tiredness. He is motivated to continue with treatment and highly commended for his treatment adherence. I reviewed records from Dr. Corky Sing office from 2017 through April 2019. At this juncture he is encouraged to follow-up in one year, he can see one of our nurse practitioners next time. I answered all his questions today and he was in agreement with the plan.  I spent 25 minutes in total face-to-face time with the patient, more than 50% of which was spent in counseling and coordination of care, reviewing test results, reviewing medication and discussing or reviewing the diagnosis of OSA, its prognosis and treatment options. Pertinent laboratory and imaging test results that were available during this visit with the patient were reviewed by me and considered in my medical decision making (see chart for details).

## 2018-07-03 NOTE — Patient Instructions (Signed)
Please continue using your autoPAP regularly. While your insurance requires that you use PAP at least 4 hours each night on 70% of the nights, I recommend, that you not skip any nights and use it throughout the night if you can. Getting used to PAP and staying with the treatment long term does take time and patience and discipline. Untreated obstructive sleep apnea when it is moderate to severe can have an adverse impact on cardiovascular health and raise her risk for heart disease, arrhythmias, hypertension, congestive heart failure, stroke and diabetes. Untreated obstructive sleep apnea causes sleep disruption, nonrestorative sleep, and sleep deprivation. This can have an impact on your day to day functioning and cause daytime sleepiness and impairment of cognitive function, memory loss, mood disturbance, and problems focussing. Using PAP regularly can improve these symptoms.   Keep up the good work! We can see you in 1 year, you can see one of our nurse practitioners as you are stable. I will see you after that.        

## 2018-07-12 DIAGNOSIS — G4733 Obstructive sleep apnea (adult) (pediatric): Secondary | ICD-10-CM | POA: Diagnosis not present

## 2018-07-19 DIAGNOSIS — Z0000 Encounter for general adult medical examination without abnormal findings: Secondary | ICD-10-CM | POA: Diagnosis not present

## 2018-07-19 DIAGNOSIS — R718 Other abnormality of red blood cells: Secondary | ICD-10-CM | POA: Diagnosis not present

## 2018-07-19 DIAGNOSIS — I10 Essential (primary) hypertension: Secondary | ICD-10-CM | POA: Diagnosis not present

## 2018-07-19 DIAGNOSIS — R82998 Other abnormal findings in urine: Secondary | ICD-10-CM | POA: Diagnosis not present

## 2018-07-19 DIAGNOSIS — Z125 Encounter for screening for malignant neoplasm of prostate: Secondary | ICD-10-CM | POA: Diagnosis not present

## 2018-07-19 DIAGNOSIS — D509 Iron deficiency anemia, unspecified: Secondary | ICD-10-CM | POA: Diagnosis not present

## 2018-07-24 DIAGNOSIS — K080 Exfoliation of teeth due to systemic causes: Secondary | ICD-10-CM | POA: Diagnosis not present

## 2018-07-26 DIAGNOSIS — Z1389 Encounter for screening for other disorder: Secondary | ICD-10-CM | POA: Diagnosis not present

## 2018-07-26 DIAGNOSIS — E785 Hyperlipidemia, unspecified: Secondary | ICD-10-CM | POA: Diagnosis not present

## 2018-07-26 DIAGNOSIS — I10 Essential (primary) hypertension: Secondary | ICD-10-CM | POA: Diagnosis not present

## 2018-07-26 DIAGNOSIS — Z23 Encounter for immunization: Secondary | ICD-10-CM | POA: Diagnosis not present

## 2018-07-26 DIAGNOSIS — Z1331 Encounter for screening for depression: Secondary | ICD-10-CM | POA: Diagnosis not present

## 2018-07-26 DIAGNOSIS — Z0000 Encounter for general adult medical examination without abnormal findings: Secondary | ICD-10-CM | POA: Diagnosis not present

## 2018-07-26 DIAGNOSIS — D509 Iron deficiency anemia, unspecified: Secondary | ICD-10-CM | POA: Diagnosis not present

## 2018-07-26 DIAGNOSIS — L309 Dermatitis, unspecified: Secondary | ICD-10-CM | POA: Diagnosis not present

## 2018-07-28 DIAGNOSIS — Z1212 Encounter for screening for malignant neoplasm of rectum: Secondary | ICD-10-CM | POA: Diagnosis not present

## 2018-08-01 DIAGNOSIS — G4733 Obstructive sleep apnea (adult) (pediatric): Secondary | ICD-10-CM | POA: Diagnosis not present

## 2018-10-13 DIAGNOSIS — G4733 Obstructive sleep apnea (adult) (pediatric): Secondary | ICD-10-CM | POA: Diagnosis not present

## 2018-10-17 DIAGNOSIS — G4733 Obstructive sleep apnea (adult) (pediatric): Secondary | ICD-10-CM | POA: Diagnosis not present

## 2018-10-17 DIAGNOSIS — Z6828 Body mass index (BMI) 28.0-28.9, adult: Secondary | ICD-10-CM | POA: Diagnosis not present

## 2018-10-17 DIAGNOSIS — R05 Cough: Secondary | ICD-10-CM | POA: Diagnosis not present

## 2018-10-17 DIAGNOSIS — I10 Essential (primary) hypertension: Secondary | ICD-10-CM | POA: Diagnosis not present

## 2018-10-17 DIAGNOSIS — D509 Iron deficiency anemia, unspecified: Secondary | ICD-10-CM | POA: Diagnosis not present

## 2018-11-13 DIAGNOSIS — G4733 Obstructive sleep apnea (adult) (pediatric): Secondary | ICD-10-CM | POA: Diagnosis not present

## 2018-11-27 DIAGNOSIS — K219 Gastro-esophageal reflux disease without esophagitis: Secondary | ICD-10-CM | POA: Diagnosis not present

## 2018-11-27 DIAGNOSIS — K644 Residual hemorrhoidal skin tags: Secondary | ICD-10-CM | POA: Diagnosis not present

## 2018-11-27 DIAGNOSIS — D509 Iron deficiency anemia, unspecified: Secondary | ICD-10-CM | POA: Diagnosis not present

## 2018-11-27 DIAGNOSIS — I10 Essential (primary) hypertension: Secondary | ICD-10-CM | POA: Diagnosis not present

## 2019-03-22 DIAGNOSIS — L570 Actinic keratosis: Secondary | ICD-10-CM | POA: Diagnosis not present

## 2019-03-22 DIAGNOSIS — D225 Melanocytic nevi of trunk: Secondary | ICD-10-CM | POA: Diagnosis not present

## 2019-03-22 DIAGNOSIS — D2261 Melanocytic nevi of right upper limb, including shoulder: Secondary | ICD-10-CM | POA: Diagnosis not present

## 2019-03-22 DIAGNOSIS — Z85828 Personal history of other malignant neoplasm of skin: Secondary | ICD-10-CM | POA: Diagnosis not present

## 2019-06-15 ENCOUNTER — Other Ambulatory Visit: Payer: Self-pay

## 2019-06-15 DIAGNOSIS — Z20822 Contact with and (suspected) exposure to covid-19: Secondary | ICD-10-CM

## 2019-06-17 LAB — NOVEL CORONAVIRUS, NAA: SARS-CoV-2, NAA: DETECTED — AB

## 2019-07-02 ENCOUNTER — Telehealth: Payer: Self-pay | Admitting: Adult Health

## 2019-07-02 NOTE — Telephone Encounter (Signed)
I called patient regarding rescheduling 11/19 appt due to MM out that AM. Requested patient call back to r/s. Patient may see Amy NP as he has never seen an NP before.

## 2019-07-04 ENCOUNTER — Encounter: Payer: Self-pay | Admitting: Adult Health

## 2019-07-04 NOTE — Telephone Encounter (Signed)
I called patient again today regarding rescheduling 11/19 appt. I LVM again requesting patient call back to r/s. Appt has been cancelled and letter sent to advise pt of this change.

## 2019-07-05 ENCOUNTER — Ambulatory Visit: Payer: Federal, State, Local not specified - PPO | Admitting: Adult Health

## 2019-08-14 DIAGNOSIS — Z125 Encounter for screening for malignant neoplasm of prostate: Secondary | ICD-10-CM | POA: Diagnosis not present

## 2019-08-14 DIAGNOSIS — E7849 Other hyperlipidemia: Secondary | ICD-10-CM | POA: Diagnosis not present

## 2019-08-14 DIAGNOSIS — Z23 Encounter for immunization: Secondary | ICD-10-CM | POA: Diagnosis not present

## 2019-08-14 DIAGNOSIS — D509 Iron deficiency anemia, unspecified: Secondary | ICD-10-CM | POA: Diagnosis not present

## 2019-08-20 DIAGNOSIS — Z01110 Encounter for hearing examination following failed hearing screening: Secondary | ICD-10-CM | POA: Diagnosis not present

## 2019-08-20 DIAGNOSIS — Z0000 Encounter for general adult medical examination without abnormal findings: Secondary | ICD-10-CM | POA: Diagnosis not present

## 2019-08-20 DIAGNOSIS — Z1331 Encounter for screening for depression: Secondary | ICD-10-CM | POA: Diagnosis not present

## 2019-08-20 DIAGNOSIS — I10 Essential (primary) hypertension: Secondary | ICD-10-CM | POA: Diagnosis not present

## 2019-08-20 DIAGNOSIS — E785 Hyperlipidemia, unspecified: Secondary | ICD-10-CM | POA: Diagnosis not present

## 2019-08-20 DIAGNOSIS — Z8616 Personal history of COVID-19: Secondary | ICD-10-CM | POA: Diagnosis not present

## 2019-08-20 DIAGNOSIS — K219 Gastro-esophageal reflux disease without esophagitis: Secondary | ICD-10-CM | POA: Diagnosis not present

## 2019-09-24 DIAGNOSIS — I10 Essential (primary) hypertension: Secondary | ICD-10-CM | POA: Diagnosis not present

## 2019-09-24 DIAGNOSIS — R1032 Left lower quadrant pain: Secondary | ICD-10-CM | POA: Diagnosis not present

## 2019-09-24 DIAGNOSIS — K5792 Diverticulitis of intestine, part unspecified, without perforation or abscess without bleeding: Secondary | ICD-10-CM | POA: Diagnosis not present

## 2019-09-25 DIAGNOSIS — H2513 Age-related nuclear cataract, bilateral: Secondary | ICD-10-CM | POA: Diagnosis not present

## 2019-09-25 DIAGNOSIS — H524 Presbyopia: Secondary | ICD-10-CM | POA: Diagnosis not present

## 2020-03-12 ENCOUNTER — Telehealth: Payer: Self-pay | Admitting: Neurology

## 2020-03-12 DIAGNOSIS — G4733 Obstructive sleep apnea (adult) (pediatric): Secondary | ICD-10-CM

## 2020-03-12 NOTE — Addendum Note (Signed)
Addended by: Judi Cong on: 03/12/2020 04:29 PM   Modules accepted: Orders

## 2020-03-12 NOTE — Telephone Encounter (Signed)
Called the patient back and advised about the bacterial filter that can be used to assist with allowing him to use the machine until he gets a new one. Order in and forwarded to [facility] for them to set him up.

## 2020-03-12 NOTE — Telephone Encounter (Signed)
Pt has called about the recall on his Respironics cpap machine. I did instruct the pt to go to [employer] and register his machine. Pt verbalized understanding. Pt stated he has not left his machine out in the heat for long periods of time. Pt stated he has used a So Clean machine.  Please advise.

## 2020-03-12 NOTE — Telephone Encounter (Signed)
Please call patient and advise him to get in touch with his DME company.  They can potentially provide him with a filter in the meantime and also given advise as to the timeline for a replacement machine.

## 2020-03-19 DIAGNOSIS — G4733 Obstructive sleep apnea (adult) (pediatric): Secondary | ICD-10-CM | POA: Diagnosis not present

## 2020-03-21 DIAGNOSIS — B078 Other viral warts: Secondary | ICD-10-CM | POA: Diagnosis not present

## 2020-03-21 DIAGNOSIS — D2262 Melanocytic nevi of left upper limb, including shoulder: Secondary | ICD-10-CM | POA: Diagnosis not present

## 2020-03-21 DIAGNOSIS — D2261 Melanocytic nevi of right upper limb, including shoulder: Secondary | ICD-10-CM | POA: Diagnosis not present

## 2020-03-21 DIAGNOSIS — Z85828 Personal history of other malignant neoplasm of skin: Secondary | ICD-10-CM | POA: Diagnosis not present

## 2020-03-21 DIAGNOSIS — L570 Actinic keratosis: Secondary | ICD-10-CM | POA: Diagnosis not present

## 2020-03-21 DIAGNOSIS — D225 Melanocytic nevi of trunk: Secondary | ICD-10-CM | POA: Diagnosis not present

## 2020-08-27 DIAGNOSIS — E785 Hyperlipidemia, unspecified: Secondary | ICD-10-CM | POA: Diagnosis not present

## 2020-08-27 DIAGNOSIS — Z125 Encounter for screening for malignant neoplasm of prostate: Secondary | ICD-10-CM | POA: Diagnosis not present

## 2020-09-03 DIAGNOSIS — R82998 Other abnormal findings in urine: Secondary | ICD-10-CM | POA: Diagnosis not present

## 2020-09-03 DIAGNOSIS — Z1212 Encounter for screening for malignant neoplasm of rectum: Secondary | ICD-10-CM | POA: Diagnosis not present

## 2020-09-03 DIAGNOSIS — R7301 Impaired fasting glucose: Secondary | ICD-10-CM | POA: Diagnosis not present

## 2020-09-03 DIAGNOSIS — I10 Essential (primary) hypertension: Secondary | ICD-10-CM | POA: Diagnosis not present

## 2020-09-03 DIAGNOSIS — Z0000 Encounter for general adult medical examination without abnormal findings: Secondary | ICD-10-CM | POA: Diagnosis not present

## 2020-09-03 LAB — IFOBT (OCCULT BLOOD): IFOBT: NEGATIVE

## 2020-09-04 ENCOUNTER — Other Ambulatory Visit: Payer: Self-pay | Admitting: Internal Medicine

## 2020-09-04 DIAGNOSIS — E785 Hyperlipidemia, unspecified: Secondary | ICD-10-CM

## 2020-09-04 DIAGNOSIS — G4733 Obstructive sleep apnea (adult) (pediatric): Secondary | ICD-10-CM | POA: Diagnosis not present

## 2020-09-22 ENCOUNTER — Ambulatory Visit
Admission: RE | Admit: 2020-09-22 | Discharge: 2020-09-22 | Disposition: A | Discharge status: Home / Self Care | Payer: No Typology Code available for payment source | Source: Ambulatory Visit | Attending: Internal Medicine | Admitting: Internal Medicine

## 2020-09-22 DIAGNOSIS — E785 Hyperlipidemia, unspecified: Secondary | ICD-10-CM

## 2020-12-16 DIAGNOSIS — I10 Essential (primary) hypertension: Secondary | ICD-10-CM | POA: Diagnosis not present

## 2020-12-16 DIAGNOSIS — R7301 Impaired fasting glucose: Secondary | ICD-10-CM | POA: Diagnosis not present

## 2021-03-03 DIAGNOSIS — E785 Hyperlipidemia, unspecified: Secondary | ICD-10-CM | POA: Diagnosis not present

## 2021-03-03 DIAGNOSIS — I10 Essential (primary) hypertension: Secondary | ICD-10-CM | POA: Diagnosis not present

## 2021-03-18 ENCOUNTER — Ambulatory Visit: Payer: Self-pay | Admitting: Cardiology

## 2021-03-18 DIAGNOSIS — R509 Fever, unspecified: Secondary | ICD-10-CM | POA: Diagnosis not present

## 2021-03-18 DIAGNOSIS — R051 Acute cough: Secondary | ICD-10-CM | POA: Diagnosis not present

## 2021-03-18 DIAGNOSIS — Z20822 Contact with and (suspected) exposure to covid-19: Secondary | ICD-10-CM | POA: Diagnosis not present

## 2021-03-18 DIAGNOSIS — R519 Headache, unspecified: Secondary | ICD-10-CM | POA: Diagnosis not present

## 2021-03-26 DIAGNOSIS — I499 Cardiac arrhythmia, unspecified: Secondary | ICD-10-CM | POA: Diagnosis not present

## 2021-03-26 DIAGNOSIS — D72829 Elevated white blood cell count, unspecified: Secondary | ICD-10-CM | POA: Diagnosis not present

## 2021-03-26 DIAGNOSIS — J40 Bronchitis, not specified as acute or chronic: Secondary | ICD-10-CM | POA: Diagnosis not present

## 2021-04-16 DIAGNOSIS — G4733 Obstructive sleep apnea (adult) (pediatric): Secondary | ICD-10-CM | POA: Diagnosis not present

## 2021-04-21 DIAGNOSIS — L570 Actinic keratosis: Secondary | ICD-10-CM | POA: Diagnosis not present

## 2021-04-21 DIAGNOSIS — D2261 Melanocytic nevi of right upper limb, including shoulder: Secondary | ICD-10-CM | POA: Diagnosis not present

## 2021-04-21 DIAGNOSIS — Z85828 Personal history of other malignant neoplasm of skin: Secondary | ICD-10-CM | POA: Diagnosis not present

## 2021-04-21 DIAGNOSIS — L988 Other specified disorders of the skin and subcutaneous tissue: Secondary | ICD-10-CM | POA: Diagnosis not present

## 2021-04-21 DIAGNOSIS — D2272 Melanocytic nevi of left lower limb, including hip: Secondary | ICD-10-CM | POA: Diagnosis not present

## 2021-04-21 DIAGNOSIS — D485 Neoplasm of uncertain behavior of skin: Secondary | ICD-10-CM | POA: Diagnosis not present

## 2021-04-21 DIAGNOSIS — D225 Melanocytic nevi of trunk: Secondary | ICD-10-CM | POA: Diagnosis not present

## 2021-05-01 DIAGNOSIS — E785 Hyperlipidemia, unspecified: Secondary | ICD-10-CM | POA: Diagnosis not present

## 2021-05-01 DIAGNOSIS — I499 Cardiac arrhythmia, unspecified: Secondary | ICD-10-CM | POA: Diagnosis not present

## 2021-05-01 DIAGNOSIS — R7301 Impaired fasting glucose: Secondary | ICD-10-CM | POA: Diagnosis not present

## 2021-05-01 DIAGNOSIS — Z23 Encounter for immunization: Secondary | ICD-10-CM | POA: Diagnosis not present

## 2021-05-16 DIAGNOSIS — G4733 Obstructive sleep apnea (adult) (pediatric): Secondary | ICD-10-CM | POA: Diagnosis not present

## 2021-05-20 ENCOUNTER — Encounter: Payer: Self-pay | Admitting: Cardiology

## 2021-05-20 DIAGNOSIS — E785 Hyperlipidemia, unspecified: Secondary | ICD-10-CM | POA: Insufficient documentation

## 2021-05-20 DIAGNOSIS — I10 Essential (primary) hypertension: Secondary | ICD-10-CM | POA: Insufficient documentation

## 2021-05-20 NOTE — Progress Notes (Signed)
Cardiology Office Note   Date:  05/21/2021   ID:  Stephen Peck, DOB May 16, 1961, MRN [id number]  PCP:  Chilton Greathouse, MD  Cardiologist:   None Referring:  Chilton Greathouse, MD  Chief Complaint  Patient presents with   ELEVATED CORONARY CALCIUM       History of Present Illness: Stephen Peck is a 60 y.o. male who presents for evaluation of coronary calcium .  He had the coronary calcium score in February.  This was 8.6 which was 41st percentile.  He has had no prior cardiac history.  He does have cardiovascular risk factors including dyslipidemia.  He eats a regular diet not particularly devoid of carbohydrates for animal-based proteins.  He walks the golf [place] but does not otherwise necessarily exercise.  He might get short of breath with some activities.  He is not describing any resting shortness of breath, PND or orthopnea.  He denies any presyncope or syncope.  He denies any chest pressure, neck or arm discomfort.  He has had no weight gain or edema.  He has noticed after he gets some golfing sometimes he gets an alert on his golf watch and says his heart rates in the 140s.  He thinks he does go up more quickly with activity but he does not have a wearable that can record an EKG.   Past Medical History:  Diagnosis Date   Dyslipidemia    Elevated liver enzymes    Fatigue    GERD (gastroesophageal reflux disease)    Hypertension    Lumbar back pain    Peripheral neuropathy    Sleep apnea     Past Surgical History:  Procedure Laterality Date   WISDOM TOOTH EXTRACTION       Current Outpatient Medications  Medication Sig Dispense Refill   Evolocumab (REPATHA) 140 MG/ML SOSY      omeprazole (PRILOSEC) 20 MG capsule Take 20 mg by mouth daily.     telmisartan-hydrochlorothiazide (MICARDIS HCT) 80-12.5 MG tablet Take 1 tablet by mouth daily.     No current facility-administered medications for this visit.    Allergies:   Patient has no known allergies.     Social History:  The patient  reports that he has never smoked. He has quit using smokeless tobacco. He reports current alcohol use of about 3.0 - 4.0 standard drinks per week. He reports that he does not use drugs.   Family History:  The patient's family history includes Cancer in his father, maternal grandmother, and paternal grandfather; Diabetes in his brother; Heart disease in his brother; Hypertension in his mother; Stroke in his mother and paternal grandmother.    ROS:  Please see the history of present illness.   Otherwise, review of systems are positive for none.   All other systems are reviewed and negative.    PHYSICAL EXAM: VS:  BP 135/72   Pulse 80   Ht 5\' 11"  (1.803 m)   Wt 214 lb 6.4 oz (97.3 kg)   SpO2 97%   BMI 29.90 kg/m  , BMI Body mass index is 29.9 kg/m. GENERAL:  Well appearing HEENT:  Pupils equal round and reactive, fundi not visualized, oral mucosa unremarkable NECK:  No jugular venous distention, waveform within normal limits, carotid upstroke brisk and symmetric, no bruits, no thyromegaly LYMPHATICS:  No cervical, inguinal adenopathy LUNGS:  Clear to auscultation bilaterally BACK:  No CVA tenderness CHEST:  Unremarkable HEART:  PMI not displaced or sustained,S1 and S2 within normal  limits, no S3, no S4, no clicks, no rubs, no murmurs ABD:  Flat, positive bowel sounds normal in frequency in pitch, no bruits, no rebound, no guarding, no midline pulsatile mass, no hepatomegaly, no splenomegaly EXT:  2 plus pulses throughout, no edema, no cyanosis no clubbing SKIN:  No rashes no nodules NEURO:  Cranial nerves II through XII grossly intact, motor grossly intact throughout PSYCH:  Cognitively intact, oriented to person place and time    EKG:  EKG is ordered today. The ekg ordered today demonstrates sinus rhythm, rate 88, axis within normal limits, intervals within normal limits, no acute ST-T wave changes.   Recent Labs: No results found for requested  labs within last 8760 hours.    Lipid Panel No results found for: CHOL, TRIG, HDL, CHOLHDL, VLDL, LDLCALC, LDLDIRECT    Wt Readings from Last 3 Encounters:  05/21/21 214 lb 6.4 oz (97.3 kg)  07/03/18 206 lb (93.4 kg)  03/27/18 205 lb (93 kg)      Other studies Reviewed: Additional studies/ records that were reviewed today include: Labs, coronary calcium score, primary care office records.  Review of the above records demonstrates:  Please see elsewhere in the note.     ASSESSMENT AND PLAN:  DYSLIPIDEMIA: We had a long discussion about this.  The  is adjusted risk score including his coronary calcium (MESA) is 6.  Without the coronary calcium is 9.2.  He is going to continue to have the discussion about management with Dr. Vassie Loll.  Regardless of any medical therapy the primary therapy would be a plant-based Mediterranean diet and exercise and we discussed these in great detail.  HTN: His blood pressure is controlled.  No change in therapy.  ELEVATED CORONARY CALCIUM: Given this and some shortness of breath I will bring him back for a POET (Plain Old Exercise Treadmill)  PALPITATIONS:   I am going to start by looking at his heart rate response with exercise.  If however there is no inducible arrhythmia I will have him wear a 2-week event monitor.  We also talked about a wearable's that he can buy.  Current medicines are reviewed at length with the patient today.  The patient does not have concerns regarding medicines.  The following changes have been made:  no change  Labs/ tests ordered today include:   Orders Placed This Encounter  Procedures   Exercise Tolerance Test   EKG 12-Lead      Disposition:   FU with me as needed.     Signed, Rollene Rotunda, MD  05/21/2021 3:54 PM    [facility]

## 2021-05-21 ENCOUNTER — Encounter: Payer: Self-pay | Admitting: Cardiology

## 2021-05-21 ENCOUNTER — Ambulatory Visit (INDEPENDENT_AMBULATORY_CARE_PROVIDER_SITE_OTHER): Payer: Federal, State, Local not specified - PPO | Admitting: Cardiology

## 2021-05-21 ENCOUNTER — Other Ambulatory Visit: Payer: Self-pay

## 2021-05-21 VITALS — BP 135/72 | HR 80 | Ht 71.0 in | Wt 214.4 lb

## 2021-05-21 DIAGNOSIS — R0602 Shortness of breath: Secondary | ICD-10-CM

## 2021-05-21 DIAGNOSIS — I10 Essential (primary) hypertension: Secondary | ICD-10-CM

## 2021-05-21 DIAGNOSIS — E785 Hyperlipidemia, unspecified: Secondary | ICD-10-CM

## 2021-05-21 NOTE — Patient Instructions (Addendum)
Medication Instructions:  Your physician recommends that you continue on your current medications as directed. Please refer to the Current Medication list given to you today.   Labwork: NONE   Testing/Procedures: Your physician has requested that you have an exercise tolerance test. For further information please visit [email or web address] Please also follow instruction sheet, as given.  Follow-Up: AS NEEDED   Any Other Special Instructions Will Be Listed Below (If Applicable).  IF YOU TREADMILL LOOKS OK THEN WILL HAVE YOU WHERE A 2 WEEK MONITOR  THIS WILL BE MAILED TO YOU IF YOU NEED IT   Mediterranean Diet A Mediterranean diet refers to food and lifestyle choices that are based on the traditions of countries located on the [employer] [place]. This way of eating has been shown to help prevent certain conditions and improve outcomes for people who have chronic diseases, like kidney disease and heart disease. What are tips for following this plan? Lifestyle Cook and eat meals together with your family, when possible. Drink enough fluid to keep your urine clear or pale yellow. Be physically active every day. This includes: Aerobic exercise like running or swimming. Leisure activities like gardening, walking, or housework. Get 7-8 hours of sleep each night. If recommended by your health care provider, drink red wine in moderation. This means 1 glass a day for nonpregnant women and 2 glasses a day for men. A glass of wine equals 5 oz (150 mL). Reading food labels  Check the serving size of packaged foods. For foods such as rice and pasta, the serving size refers to the amount of cooked product, not dry. Check the total fat in packaged foods. Avoid foods that have saturated fat or trans fats. Check the ingredients list for added sugars, such as corn syrup. Shopping At the grocery store, buy most of your food from the areas near the walls of the store. This includes: Fresh fruits and  vegetables (produce). Grains, beans, nuts, and seeds. Some of these may be available in unpackaged forms or large amounts (in bulk). Fresh seafood. Poultry and eggs. Low-fat dairy products. Buy whole ingredients instead of prepackaged foods. Buy fresh fruits and vegetables in-season from local farmers markets. Buy frozen fruits and vegetables in resealable bags. If you do not have access to quality fresh seafood, buy precooked frozen shrimp or canned fish, such as tuna, salmon, or sardines. Buy small amounts of raw or cooked vegetables, salads, or olives from the deli or salad bar at your store. Stock your pantry so you always have certain foods on hand, such as olive oil, canned tuna, canned tomatoes, rice, pasta, and beans. Cooking Cook foods with extra-virgin olive oil instead of using butter or other vegetable oils. Have meat as a side dish, and have vegetables or grains as your main dish. This means having meat in small portions or adding small amounts of meat to foods like pasta or stew. Use beans or vegetables instead of meat in common dishes like chili or lasagna. Experiment with different cooking methods. Try roasting or broiling vegetables instead of steaming or sauteing them. Add frozen vegetables to soups, stews, pasta, or rice. Add nuts or seeds for added healthy fat at each meal. You can add these to yogurt, salads, or vegetable dishes. Marinate fish or vegetables using olive oil, lemon juice, garlic, and fresh herbs. Meal planning  Plan to eat 1 vegetarian meal one day each week. Try to work up to 2 vegetarian meals, if possible. Eat seafood 2 or more times a week.  Have healthy snacks readily available, such as: Vegetable sticks with hummus. Greek yogurt. Fruit and nut trail mix. Eat balanced meals throughout the week. This includes: Fruit: 2-3 servings a day Vegetables: 4-5 servings a day Low-fat dairy: 2 servings a day Fish, poultry, or lean meat: 1 serving a  day Beans and legumes: 2 or more servings a week Nuts and seeds: 1-2 servings a day Whole grains: 6-8 servings a day Extra-virgin olive oil: 3-4 servings a day Limit red meat and sweets to only a few servings a month What are my food choices? Mediterranean diet Recommended Grains: Whole-grain pasta. Brown rice. Bulgar wheat. Polenta. Couscous. Whole-wheat bread. Orpah Cobb. Vegetables: Artichokes. Beets. Broccoli. Cabbage. Carrots. Eggplant. Green beans. Chard. Kale. Spinach. Onions. Leeks. Peas. Squash. Tomatoes. Peppers. Radishes. Fruits: Apples. Apricots. Avocado. Berries. Bananas. Cherries. Dates. Figs. Grapes. Lemons. Melon. Oranges. Peaches. Plums. Pomegranate. Meats and other protein foods: Beans. Almonds. Sunflower seeds. Pine nuts. Peanuts. Cod. Salmon. Scallops. Shrimp. Tuna. Tilapia. Clams. Oysters. Eggs. Dairy: Low-fat milk. Cheese. Greek yogurt. Beverages: Water. Red wine. Herbal tea. Fats and oils: Extra virgin olive oil. Avocado oil. Grape seed oil. Sweets and desserts: [place] yogurt with honey. Baked apples. Poached pears. Trail mix. Seasoning and other foods: Basil. Cilantro. Coriander. Cumin. Mint. Parsley. Sage. Rosemary. Tarragon. Garlic. Oregano. Thyme. Pepper. Balsalmic vinegar. Tahini. Hummus. Tomato sauce. Olives. Mushrooms. Limit these Grains: Prepackaged pasta or rice dishes. Prepackaged cereal with added sugar. Vegetables: Deep fried potatoes (french fries). Fruits: Fruit canned in syrup. Meats and other protein foods: Beef. Pork. Lamb. Poultry with skin. Hot dogs. Tomasa Blase. Dairy: Ice cream. Sour cream. Whole milk. Beverages: Juice. Sugar-sweetened soft drinks. Beer. Liquor and spirits. Fats and oils: Butter. Canola oil. Vegetable oil. Beef fat (tallow). Lard. Sweets and desserts: Cookies. Cakes. Pies. Candy. Seasoning and other foods: Mayonnaise. Premade sauces and marinades. The items listed may not be a complete list. Talk with your dietitian about what  dietary choices are right for you. Summary The Mediterranean diet includes both food and lifestyle choices. Eat a variety of fresh fruits and vegetables, beans, nuts, seeds, and whole grains. Limit the amount of red meat and sweets that you eat. Talk with your health care provider about whether it is safe for you to drink red wine in moderation. This means 1 glass a day for nonpregnant women and 2 glasses a day for men. A glass of wine equals 5 oz (150 mL). This information is not intended to replace advice given to you by your health care provider. Make sure you discuss any questions you have with your health care provider. Document Revised: 04/01/2016 Document Reviewed: 03/25/2016 Elsevier Patient Education  2020 [employer].

## 2021-05-29 ENCOUNTER — Telehealth (HOSPITAL_COMMUNITY): Payer: Self-pay | Admitting: *Deleted

## 2021-05-29 NOTE — Telephone Encounter (Signed)
Close encounter 

## 2021-06-02 ENCOUNTER — Other Ambulatory Visit: Payer: Self-pay

## 2021-06-02 ENCOUNTER — Ambulatory Visit (HOSPITAL_COMMUNITY)
Admission: RE | Admit: 2021-06-02 | Discharge: 2021-06-02 | Disposition: A | Discharge status: Home / Self Care | Payer: Federal, State, Local not specified - PPO | Source: Ambulatory Visit | Attending: Internal Medicine | Admitting: Internal Medicine

## 2021-06-02 DIAGNOSIS — R0602 Shortness of breath: Secondary | ICD-10-CM

## 2021-06-02 DIAGNOSIS — I10 Essential (primary) hypertension: Secondary | ICD-10-CM | POA: Diagnosis not present

## 2021-06-02 DIAGNOSIS — E785 Hyperlipidemia, unspecified: Secondary | ICD-10-CM | POA: Diagnosis not present

## 2021-06-02 LAB — EXERCISE TOLERANCE TEST
Estimated workload: 12.3
Exercise duration (min): 10 min
Exercise duration (sec): 23 s
MPHR: 161 {beats}/min
Peak HR: 166 {beats}/min
Percent HR: 103 %
Rest HR: 67 {beats}/min
ST Depression (mm): 0 mm

## 2021-06-16 DIAGNOSIS — G4733 Obstructive sleep apnea (adult) (pediatric): Secondary | ICD-10-CM | POA: Diagnosis not present

## 2021-06-17 ENCOUNTER — Encounter: Payer: Self-pay | Admitting: *Deleted

## 2021-06-17 ENCOUNTER — Ambulatory Visit (INDEPENDENT_AMBULATORY_CARE_PROVIDER_SITE_OTHER): Payer: Federal, State, Local not specified - PPO

## 2021-06-17 ENCOUNTER — Other Ambulatory Visit: Payer: Self-pay | Admitting: *Deleted

## 2021-06-17 DIAGNOSIS — R002 Palpitations: Secondary | ICD-10-CM

## 2021-06-17 NOTE — Progress Notes (Unsigned)
Enrolled patient for a 14 day Zio XT  monitor to be mailed to patients home  °

## 2021-06-21 DIAGNOSIS — R002 Palpitations: Secondary | ICD-10-CM | POA: Diagnosis not present

## 2021-09-10 DIAGNOSIS — Z125 Encounter for screening for malignant neoplasm of prostate: Secondary | ICD-10-CM | POA: Diagnosis not present

## 2021-09-10 DIAGNOSIS — E785 Hyperlipidemia, unspecified: Secondary | ICD-10-CM | POA: Diagnosis not present

## 2021-09-17 DIAGNOSIS — I10 Essential (primary) hypertension: Secondary | ICD-10-CM | POA: Diagnosis not present

## 2021-09-17 DIAGNOSIS — Z0000 Encounter for general adult medical examination without abnormal findings: Secondary | ICD-10-CM | POA: Diagnosis not present

## 2021-09-17 DIAGNOSIS — R82998 Other abnormal findings in urine: Secondary | ICD-10-CM | POA: Diagnosis not present

## 2021-09-17 DIAGNOSIS — R7301 Impaired fasting glucose: Secondary | ICD-10-CM | POA: Diagnosis not present

## 2021-11-10 DIAGNOSIS — K080 Exfoliation of teeth due to systemic causes: Secondary | ICD-10-CM | POA: Diagnosis not present

## 2021-12-31 DIAGNOSIS — R631 Polydipsia: Secondary | ICD-10-CM | POA: Diagnosis not present

## 2021-12-31 DIAGNOSIS — R109 Unspecified abdominal pain: Secondary | ICD-10-CM | POA: Diagnosis not present

## 2021-12-31 DIAGNOSIS — W57XXXA Bitten or stung by nonvenomous insect and other nonvenomous arthropods, initial encounter: Secondary | ICD-10-CM | POA: Diagnosis not present

## 2021-12-31 DIAGNOSIS — R197 Diarrhea, unspecified: Secondary | ICD-10-CM | POA: Diagnosis not present

## 2022-02-08 DIAGNOSIS — I10 Essential (primary) hypertension: Secondary | ICD-10-CM | POA: Diagnosis not present

## 2022-02-08 DIAGNOSIS — R7301 Impaired fasting glucose: Secondary | ICD-10-CM | POA: Diagnosis not present

## 2022-02-08 DIAGNOSIS — K760 Fatty (change of) liver, not elsewhere classified: Secondary | ICD-10-CM | POA: Diagnosis not present

## 2022-02-08 DIAGNOSIS — Z1339 Encounter for screening examination for other mental health and behavioral disorders: Secondary | ICD-10-CM | POA: Diagnosis not present

## 2022-05-04 DIAGNOSIS — D1801 Hemangioma of skin and subcutaneous tissue: Secondary | ICD-10-CM | POA: Diagnosis not present

## 2022-05-04 DIAGNOSIS — Z85828 Personal history of other malignant neoplasm of skin: Secondary | ICD-10-CM | POA: Diagnosis not present

## 2022-05-04 DIAGNOSIS — D225 Melanocytic nevi of trunk: Secondary | ICD-10-CM | POA: Diagnosis not present

## 2022-05-04 DIAGNOSIS — L814 Other melanin hyperpigmentation: Secondary | ICD-10-CM | POA: Diagnosis not present

## 2022-05-07 DIAGNOSIS — G4733 Obstructive sleep apnea (adult) (pediatric): Secondary | ICD-10-CM | POA: Diagnosis not present

## 2022-06-06 DIAGNOSIS — G4733 Obstructive sleep apnea (adult) (pediatric): Secondary | ICD-10-CM | POA: Diagnosis not present

## 2022-06-08 DIAGNOSIS — G4733 Obstructive sleep apnea (adult) (pediatric): Secondary | ICD-10-CM | POA: Diagnosis not present

## 2022-07-07 DIAGNOSIS — G4733 Obstructive sleep apnea (adult) (pediatric): Secondary | ICD-10-CM | POA: Diagnosis not present

## 2022-07-09 DIAGNOSIS — G4733 Obstructive sleep apnea (adult) (pediatric): Secondary | ICD-10-CM | POA: Diagnosis not present

## 2022-08-08 DIAGNOSIS — G4733 Obstructive sleep apnea (adult) (pediatric): Secondary | ICD-10-CM | POA: Diagnosis not present

## 2022-10-04 DIAGNOSIS — C44729 Squamous cell carcinoma of skin of left lower limb, including hip: Secondary | ICD-10-CM | POA: Diagnosis not present

## 2022-10-04 DIAGNOSIS — L820 Inflamed seborrheic keratosis: Secondary | ICD-10-CM | POA: Diagnosis not present

## 2022-10-20 DIAGNOSIS — E785 Hyperlipidemia, unspecified: Secondary | ICD-10-CM | POA: Diagnosis not present

## 2022-10-20 DIAGNOSIS — I10 Essential (primary) hypertension: Secondary | ICD-10-CM | POA: Diagnosis not present

## 2022-10-20 DIAGNOSIS — Z125 Encounter for screening for malignant neoplasm of prostate: Secondary | ICD-10-CM | POA: Diagnosis not present

## 2022-10-20 DIAGNOSIS — R7989 Other specified abnormal findings of blood chemistry: Secondary | ICD-10-CM | POA: Diagnosis not present

## 2022-10-20 DIAGNOSIS — D509 Iron deficiency anemia, unspecified: Secondary | ICD-10-CM | POA: Diagnosis not present

## 2022-10-27 DIAGNOSIS — Z1212 Encounter for screening for malignant neoplasm of rectum: Secondary | ICD-10-CM | POA: Diagnosis not present

## 2022-10-27 DIAGNOSIS — D509 Iron deficiency anemia, unspecified: Secondary | ICD-10-CM | POA: Diagnosis not present

## 2022-10-27 DIAGNOSIS — Z0000 Encounter for general adult medical examination without abnormal findings: Secondary | ICD-10-CM | POA: Diagnosis not present

## 2022-10-27 DIAGNOSIS — E785 Hyperlipidemia, unspecified: Secondary | ICD-10-CM | POA: Diagnosis not present

## 2022-10-27 DIAGNOSIS — R82998 Other abnormal findings in urine: Secondary | ICD-10-CM | POA: Diagnosis not present

## 2022-10-27 DIAGNOSIS — I77819 Aortic ectasia, unspecified site: Secondary | ICD-10-CM | POA: Diagnosis not present

## 2022-10-27 DIAGNOSIS — I499 Cardiac arrhythmia, unspecified: Secondary | ICD-10-CM | POA: Diagnosis not present

## 2022-10-27 DIAGNOSIS — I10 Essential (primary) hypertension: Secondary | ICD-10-CM | POA: Diagnosis not present

## 2022-10-27 DIAGNOSIS — Z23 Encounter for immunization: Secondary | ICD-10-CM | POA: Diagnosis not present

## 2022-11-01 ENCOUNTER — Encounter: Payer: Self-pay | Admitting: *Deleted

## 2022-11-01 ENCOUNTER — Institutional Professional Consult (permissible substitution): Payer: Federal, State, Local not specified - PPO | Admitting: Neurology

## 2022-11-02 ENCOUNTER — Telehealth: Payer: Self-pay | Admitting: Neurology

## 2022-11-02 ENCOUNTER — Institutional Professional Consult (permissible substitution): Payer: Federal, State, Local not specified - PPO | Admitting: Neurology

## 2022-11-02 NOTE — Telephone Encounter (Signed)
LVM and sent mychart msg informing pt of r/s needed for today's appt- MD out. 

## 2022-11-29 ENCOUNTER — Encounter: Payer: Self-pay | Admitting: Neurology

## 2022-11-29 ENCOUNTER — Ambulatory Visit: Payer: Federal, State, Local not specified - PPO | Admitting: Neurology

## 2022-11-29 VITALS — BP 123/77 | HR 71 | Wt 216.0 lb

## 2022-11-29 DIAGNOSIS — E669 Obesity, unspecified: Secondary | ICD-10-CM

## 2022-11-29 DIAGNOSIS — G4733 Obstructive sleep apnea (adult) (pediatric): Secondary | ICD-10-CM

## 2022-11-29 DIAGNOSIS — R635 Abnormal weight gain: Secondary | ICD-10-CM | POA: Diagnosis not present

## 2022-11-29 NOTE — Patient Instructions (Addendum)
It was nice to see you again today.   As discussed, we will proceed with a home sleep test (HST) to re-establish your sleep apnea diagnosis and to get you a new machine. Our sleep lab staff will reach out to you to arrange for pickup and for tutorial of your test equipment - you will do the test at home that night and bring the test sensors back for data analysis the next day or whenever you are scheduled for drop off of your test equipment. I will write for a new machine after your HST confirms your obstructive sleep apnea diagnosis.   Please remember, you will not use your autoPAP the night of your testing.  This is so we get diagnostic data, we do not need treatment data.    Most people who are very consistent with their AutoPap or CPAP use do not sleep very well without the machine - this is understandable, but as long as we have just sleep data that confirms your sleep apnea diagnosis we should be good.   We will schedule a follow-up appointment after set up with your new machine, typically within 31 to 89 days post treatment start. You will need to show compliance with usage and fulfill a minimum usage percentage (this is an insurance requirement).  After you have done your home sleep test, you can resume using your current machine until you get a new one.   Please continue using your PAP regularly. While your insurance requires that you use CPAP at least 4 hours each night on 70% of the nights, I recommend, that you not skip any nights and use it throughout the night if you can. Getting used to CPAP and staying with the treatment long term does take time and patience and discipline. Untreated obstructive sleep apnea when it is moderate to severe can have an adverse impact on cardiovascular health and raise her risk for heart disease, arrhythmias, hypertension, congestive heart failure, stroke and diabetes. Untreated obstructive sleep apnea causes sleep disruption, nonrestorative sleep, and sleep  deprivation. This can have an impact on your day to day functioning and cause daytime sleepiness and impairment of cognitive function, memory loss, mood disturbance, and problems focussing. Using CPAP regularly can improve these symptoms.  I will write for a new travel CPAP machine. You can talk to your DME provider about getting a machine through them or go online.

## 2022-11-29 NOTE — Progress Notes (Signed)
Subjective:    Patient ID: Stephen Peck is a 62 y.o. male.  HPI    Huston Foley, MD, PhD [facility] [address], [address] [address] [place], [place] [address]  Dear Dr. Felipa Eth,  I saw your patient, Stephen Peck, to my sleep clinic today for evaluation of his sleep apnea.  The patient is unaccompanied today.  As you know, Stephen Peck is a 63 year old male with an underlying medical history of hypertension, low back pain, peripheral neuropathy, plantar fasciitis, reflux disease, elevated liver enzymes, hyperlipidemia, atypical chest pain, aortic dilatation, and overweight state, who was previously diagnosed with obstructive sleep apnea and has been on PAP therapy.  He has not been seen in our sleep clinic in over 3 years. He had a a split sleep study on 01/30/2016. His baseline AHI was 16.2 per hour, O2 nadir was 77%. He was titrated on CPAP up to 10 cm, is AHI was 0 per hour on the pressure of 10 cm.  He was originally treated with an oral appliance through Dr. Toni Arthurs.  He started home AutoPap therapy in the fall 2019.  His Epworth sleepiness score is 9 out of 24, fatigue severity score is 36 out of 63.  He is very consistent with his AutoPap machine and would like to look into getting a travel machine.  He received a refurbished machine after the original recall.  I reviewed your office note from 02/08/2022. He had a [employer] and received a replacement after the recall.  I was able to review his compliance data from his machine, he has a Youth worker from [employer].  Between 10/29/2022 and 11/27/2022 he used his machine 30 out of 30 days with percent use days greater than 4 hours at 100%, indicating superb compliance, average usage of 7 hours and 35 minutes, residual AHI at goal at 3.7/h, 90th percentile of pressure at 8.9 cm with a range of 7 to 13 cm, he is on an AutoPap.  He no longer uses an oral appliance. Bedtime is generally  around 11 and rise time around 7.  He lives at home with his family, daughter is a Medical laboratory scientific officer in college at [employer] and his son is a Holiday representative in high school.  Weight has been fluctuating but he has had slow weight gain over time.  He drinks caffeine in the form of coffee, 1 large cup in the morning, alcohol mainly on the weekends.  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  Previously (copied from previous notes for reference):   07/03/2018: Stephen Peck is a 71 year old right-handed gentleman with an underlying medical history of hyperlipidemia, reflux disease, hypertension, atypical chest pain, low back pain, peripheral neuropathy and overweight state, who presents for follow-up consultation of his obstructive sleep apnea. I last saw him on 03/27/2018, at which time he reported problems with his oral appliance. He had been using an oral appliance fairly successfully for about 2 years but had increase in snoring and sleep-related symptoms including nighttime awakenings and occasional morning headaches. He was more restless. I suggested we proceed with AutoPap therapy.   I was able to review Dr. Alveda Reasons records in the interim home sleep testing from 05/12/2016 showed an AHI of 19.7 per hour, O2 nadir of 82%. He had several posttreatment home sleep test, including 05/13/2016, 07/14/2016, 12/23/2016, at which time his AHI was 18.8 per hour, 01/31/2017,  which showed an AHI of 23.1 per hour and most recently on 08/22/2017, at which time his AHI was 18 per hour, O2 nadir of 82%. Clinic notes reviewed till 11/17/17. 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   I reviewed his AutoPap compliance data from 05/30/2018 through 06/28/2018, which is a total of 30 days, during which time he used his AutoPap every night with percent used days greater than 4 hours at 100%, indicating superb compliance with an average usage of 7  hours and 18 minutes which is very good, residual AHI at goal but on the higher end at 4.4 per hour, 90th percentile of pressure at 9.1 cm with a range of 7 cm to 13 cm. Leak very low. He reports doing well. He actually feels better rested, he is motivated to continue with treatment. He is tracking his progress also on the cell phone app. He is using a nasal cradle interface, tried the nasal pillows but did not like them as much. He is pleased with his outcome so far. He uses the lower part of his previous oral appliance as a bite guard, he actually broke the top part accidentally.     I first met him on 07/15/2015 at the request of his primary care physician, at which time he reported snoring and witnessed apneas as well as excessive daytime somnolence with an Epworth score of 11. His BMI was 26 at the time. He was advised to proceed with sleep study. He had a a split sleep study on 01/30/2016. His baseline AHI was 16.2 per hour, O2 nadir was 77%. He was titrated on CPAP up to 10 cm, is AHI was 0 per hour on the pressure of 10 cm. The patient did not pursue CPAP therapy at the time.   07/15/2015: (He) reports snoring, witnessed apneas per wife and excessive daytime somnolence. He has had infrequent choking sensations while asleep. His Epworth sleepiness score is 11 out of 24 today, his fatigue score is 30 out of 63.in the recent past, he has had some muscle aching and joint pains in particular knee pain bilaterally which was attributed to being on Crestor. About a month ago he was switched to low-dose pravastatin. Symptoms of aching and joint pains improved but he still has aching in both forearms close to the elbow region bilaterally. He denies any symptoms consistent with carpal tunnel syndrome. He denies morning headaches. He has occasional restless leg symptoms and has to move his legs at night. He is overall a fairly restless and light sleeper. Bedtime varies from 10 PM to midnight. He falls asleep fairly  quickly. He has multiple nighttime awakenings but does not stay awake long. He has a rise time of 6:30 on weekdays and may sleep until 7:30 on weekends. He wakes up marginally to adequately rested. He has no significant nocturia. He is not aware of any family history of OSA. His weight has been fairly stable in the past 5-6 years. He lives at home with his wife and 2 children, ages 79 and 30. He works full-time as a Surveyor, minerals for [employer]. He drinks alcohol socially, he does not smoke, he drinks caffeine in the form of coffee.    His Past Medical History Is Significant For: Past Medical History:  Diagnosis Date   Abnormal liver function tests    Aortic dilatation    ascending; 3.2 cm based on CT scan 09/2020   Atypical chest pain  Dyslipidemia    Elevated liver enzymes    External hemorrhoid    Fatigue    GERD (gastroesophageal reflux disease)    Hematuria, unspecified    Hypertension    Iron deficiency anemia    Lumbar back pain    Lumbar back pain    Peripheral neuropathy    Plantar fasciitis    R heel   Sleep apnea    cpap    His Past Surgical History Is Significant For: Past Surgical History:  Procedure Laterality Date   WISDOM TOOTH EXTRACTION      His Family History Is Significant For: Family History  Problem Relation Age of Onset   Stroke Mother    Hypertension Mother    Cancer Father        Lung   Heart disease Brother        Heart valve   Diabetes Brother    Cancer Maternal Grandmother    Stroke Paternal Grandmother    Cancer Paternal Grandfather    Sleep apnea Neg Hx     His Social History Is Significant For: Social History   Socioeconomic History   Marital status: Married    Spouse name: Not on file   Number of children: 2   Years of education: College   Highest education level: Not on file  Occupational History   Occupation: MBA [employer]  Tobacco Use   Smoking status: Never   Smokeless tobacco: Former  Substance and Sexual Activity    Alcohol use: Yes    Alcohol/week: 7.0 standard drinks of alcohol    Types: 3 Glasses of wine, 3 Cans of beer, 1 Shots of liquor per week   Drug use: No   Sexual activity: Not on file  Other Topics Concern   Not on file  Social History Narrative   Drinks coffee daily    Social Determinants of Health   Financial Resource Strain: Not on file  Food Insecurity: Not on file  Transportation Needs: Not on file  Physical Activity: Not on file  Stress: Not on file  Social Connections: Not on file    His Allergies Are:  No Known Allergies:   His Current Medications Are:  Outpatient Encounter Medications as of 11/29/2022  Medication Sig   Ascorbic Acid (VITAMIN C PO) Take by mouth.   Evolocumab (REPATHA) 140 MG/ML SOSY    omeprazole (PRILOSEC) 20 MG capsule Take 20 mg by mouth daily.   telmisartan-hydrochlorothiazide (MICARDIS HCT) 80-12.5 MG tablet Take 1 tablet by mouth daily.   No facility-administered encounter medications on file as of 11/29/2022.  :   Review of Systems:  Out of a complete 14 point review of systems, all are reviewed and negative with the exception of these symptoms as listed below:  Review of Systems  Neurological:        Pt here for for sleep consult Pt states uses CPAP machine 4+ hours nightly Pt states had sleep study 2017 and CPAP 2017 Pt states does well with CPAP machine Pt states that he does travel for work and he wants to discuss travel CPAP     ESS:9  FSS:36    Objective:  Neurological Exam  Physical Exam Physical Examination:   Vitals:   11/29/22 0831  BP: 123/77  Pulse: 71    General Examination: The patient is a very pleasant 61 y.o. male in no acute distress. He appears well-developed and well-nourished and well groomed.   HEENT: Normocephalic, atraumatic, pupils are equal, round and  reactive to light, extraocular tracking is good without limitation to gaze excursion or nystagmus noted. Hearing is grossly intact. Face is symmetric  with normal facial animation. Speech is clear with no dysarthria noted. There is no hypophonia. There is no lip, neck/head, jaw or voice tremor. Neck is supple with full range of passive and active motion. There are no carotid bruits on auscultation. Oropharynx exam reveals: mild mouth dryness, good dental hygiene and moderate airway crowding, due to slightly wider uvula, tonsils about 1+ bilaterally, Mallampati class II.  Neck circumference 17-7/8 inches.  Minimal overbite.  Tongue protrudes centrally and palate elevates symmetrically.  Chest: Clear to auscultation without wheezing, rhonchi or crackles noted.  Heart: S1+S2+0, regular and normal without murmurs, rubs or gallops noted.   Abdomen: Soft, non-tender and non-distended.  Extremities: There is no pitting edema in the distal lower extremities bilaterally.   Skin: Warm and dry without trophic changes noted.   Musculoskeletal: exam reveals no obvious joint deformities.   Neurologically:  Mental status: The patient is awake, alert and oriented in all 4 spheres. His immediate and remote memory, attention, language skills and fund of knowledge are appropriate. There is no evidence of aphasia, agnosia, apraxia or anomia. Speech is clear with normal prosody and enunciation. Thought process is linear. Mood is normal and affect is normal.  Cranial nerves II - XII are as described above under HEENT exam.  Motor exam: Normal bulk, strength and tone is noted. There is no obvious action or resting tremor.  Fine motor skills and coordination: grossly intact.  Cerebellar testing: No dysmetria or intention tremor. There is no truncal or gait ataxia.  Sensory exam: intact to light touch in the upper and lower extremities.  Gait, station and balance: He stands easily. No veering to one side is noted. No leaning to one side is noted. Posture is age-appropriate and stance is narrow based. Gait shows normal stride length and normal pace. No problems turning  are noted. '  Assessment and Plan:  In summary, Stephen Peck is a very pleasant 69 y.o.-year old male with an underlying medical history of hypertension, low back pain, peripheral neuropathy, plantar fasciitis, reflux disease, elevated liver enzymes, hyperlipidemia, atypical chest pain, aortic dilatation, and overweight state, who presents for evaluation of his obstructive sleep apnea.  He was diagnosed with moderate obstructive sleep apnea in 2017.  He has been on AutoPap therapy since 2019.  He would be eligible for a new machine soon.  He did get a replacement from his recalled [employer] but would like to get a different brand altogether.  We will proceed with a home sleep test.  He also would like to get a travel machine and I wrote for a travel CPAP with a set pressure of 9 cm.  He is fully compliant with his current AutoPap machine and commended for it.  He has benefited from treatment and is very motivated to continue with it.  We talked about alternative treatment options and he is not interested in any surgical treatment.  He tried an oral appliance through dentistry which did not work out for him.  He is encouraged to continue to work on weight loss.  We will proceed with a home sleep test to establish his diagnosis again and I will write for a new AutoPap machine after that.  I did give him a prescription for a travel CPAP machine as well today.  We will plan a follow-up after home sleep testing and  after he starts treatment with a new PAP machine.  I answered all his questions today and he was in agreement with our plan.  Thank you very much for allowing me to participate in the care of this nice patient. If I can be of any further assistance to you please do not hesitate to call me at [phone].  Sincerely,   Huston Foley, MD, PhD

## 2022-12-22 ENCOUNTER — Telehealth: Payer: Self-pay | Admitting: Neurology

## 2022-12-22 NOTE — Telephone Encounter (Signed)
MAIL OUT HST- [employer] no auth req ref # Stephen Peck on 12/14/22   On the schedule for 12/29/22. Aware to do the sleep study within a week of receiving the device.

## 2022-12-29 ENCOUNTER — Ambulatory Visit: Payer: Federal, State, Local not specified - PPO | Admitting: Neurology

## 2022-12-29 DIAGNOSIS — G4733 Obstructive sleep apnea (adult) (pediatric): Secondary | ICD-10-CM | POA: Diagnosis not present

## 2022-12-29 DIAGNOSIS — R635 Abnormal weight gain: Secondary | ICD-10-CM

## 2022-12-29 DIAGNOSIS — E66811 Obesity, class 1: Secondary | ICD-10-CM

## 2022-12-29 DIAGNOSIS — E669 Obesity, unspecified: Secondary | ICD-10-CM

## 2022-12-29 DIAGNOSIS — G4734 Idiopathic sleep related nonobstructive alveolar hypoventilation: Secondary | ICD-10-CM

## 2023-01-12 NOTE — Addendum Note (Signed)
Addended by: Huston Foley on: 01/12/2023 12:02 PM   Modules accepted: Orders

## 2023-01-12 NOTE — Procedures (Signed)
Stephen Peck  HOME SLEEP TEST (Watch PAT) REPORT  STUDY DATE: 01/05/2023  DOB: 30-Sep-1960  MRN: [id number]  ORDERING CLINICIAN: Huston Foley, MD, PhD   REFERRING CLINICIAN: Chilton Greathouse, MD   CLINICAL INFORMATION/HISTORY:  62 year old male with an underlying medical history of hypertension, low back pain, peripheral neuropathy, plantar fasciitis, reflux disease, elevated liver enzymes, hyperlipidemia, atypical chest pain, aortic dilatation, and overweight state, who presents for reevaluation of his obstructive sleep apnea.  He has been on AutoPap of 7 to 13 cm, with compliance and good apnea control, he has an older machine and qualifies for new equipment.  Epworth sleepiness score: 9/24.  BMI: 30.2 kg/m  FINDINGS:   Sleep Summary:   Total Recording Time (hours, min): 7 hours, 16 min  Total Sleep Time (hours, min):  6 hours, 16 min  Percent REM (%):    14.9%   Respiratory Indices:   Calculated pAHI (per hour):  58.8/hour         REM pAHI:    48.3/hour       NREM pAHI: 60.7/hour  Central pAHI: 2.8/hour  Oxygen Saturation Statistics:    Oxygen Saturation (%) Mean: 90%   Minimum oxygen saturation (%):                 80%   O2 Saturation Range (%): 80 -97%    O2 Saturation (minutes) <=88%: 49.7 min  Pulse Rate Statistics:   Pulse Mean (bpm):    74/min    Pulse Range (59 - 100/min)   IMPRESSION: OSA (obstructive sleep apnea), severe Nocturnal Hypoxemia  RECOMMENDATION:  This home sleep test demonstrates severe obstructive sleep apnea with a total AHI of 58.8/hour and O2 nadir of 80% with significant time below or at 88% saturation of over 45 minutes for the night, indicating nocturnal hypoxemia.  Snoring was in the mild to moderate range, at times louder.  Ongoing treatment with positive airway pressure is highly recommended. The patient has been on AutoPap therapy, I will prescribe a new machine and new equipment for him, we can keep the  pressure at his current range of 7 to 13 cm as he has had good apnea control, average pressure has been around 9 cm. A laboratory attended titration study can be considered in the future for optimization of treatment settings and to improve tolerance and compliance. Alternative treatment options are limited secondary to the severity of the patient's sleep disordered breathing, but may include surgical treatment with an implantable hypoglossal nerve stimulator (in carefully selected candidates, meeting criteria).  Concomitant weight loss is recommended, where clinically appropriate. Please note, that untreated obstructive sleep apnea may carry additional perioperative morbidity. Patients with significant obstructive sleep apnea should receive perioperative PAP therapy and the surgeons and particularly the anesthesiologist should be informed of the diagnosis and the severity of the sleep disordered breathing. The patient should be cautioned not to drive, work at heights, or operate dangerous or heavy equipment when tired or sleepy. Review and reiteration of good sleep hygiene measures should be pursued with any patient. Other causes of the patient's symptoms, including circadian rhythm disturbances, an underlying mood disorder, medication effect and/or an underlying medical problem cannot be ruled out based on this test. Clinical correlation is recommended.  The patient and his referring provider will be notified of the test results. The patient will be seen in follow up in sleep clinic at [facility].  I certify that I have reviewed the raw data recording prior to the issuance  of this report in accordance with the standards of the American Academy of Sleep Medicine (AASM).  INTERPRETING PHYSICIAN:   Huston Foley, MD, PhD Medical Director, [facility]) Diplomat, ABPN (Neurology and Sleep)   [facility] [address], [address] [place], [place]  [address] [phone]

## 2023-01-17 ENCOUNTER — Telehealth: Payer: Self-pay | Admitting: *Deleted

## 2023-01-17 NOTE — Telephone Encounter (Signed)
LMVM for pt to return call for sleep study results.  

## 2023-01-17 NOTE — Telephone Encounter (Signed)
-----   Message from Huston Foley, MD sent at 01/12/2023 12:02 PM EDT ----- Patient referred by his PCP for reevaluation of his sleep apnea.  He has an older Higher education careers adviser from [employer]  I saw him on 11/29/2022 and he had a home sleep test on 01/05/2023. Please advise patient that his home sleep test confirmed obstructive sleep apnea, it is in the severe range.  I would like to write for a new machine, if he has a current DME company he would like to stay with, that is fine, if he would like to change his DME, we can send the order to a new DME provider.  Please encourage ongoing full compliance and advised patient that we will need to follow-up in sleep clinic within 3 months after starting treatment with his new machine.  He can see one of our nurse practitioners as well.   Thanks,   Huston Foley, MD, PhD [facility])

## 2023-01-18 ENCOUNTER — Telehealth: Payer: Self-pay | Admitting: *Deleted

## 2023-01-18 NOTE — Telephone Encounter (Signed)
Patient called back to discuss sleep study results. York Spaniel he is available all afternoon and expecting a call back. Thank you!

## 2023-01-18 NOTE — Telephone Encounter (Signed)
Spoke to patient gave sleep study results Pt informed he has severe OSA Pt chose [employer] for DME  Informed pt of insurance compliance . Gave pt [employer] # ,Made pt f/u appointment with Megan,NP on 04/2023 Faxed over new orders to DME  and forward sleep study results to PCP this afternoon Pt expressed understanding and thanked me for calling

## 2023-01-18 NOTE — Telephone Encounter (Signed)
-----   Message from Saima Athar, MD sent at 01/12/2023 12:02 PM EDT ----- Patient referred by his PCP for reevaluation of his sleep apnea.  He has an older AutoPap machine from [employer].  I saw him on 11/29/2022 and he had a home sleep test on 01/05/2023. Please advise patient that his home sleep test confirmed obstructive sleep apnea, it is in the severe range.  I would like to write for a new machine, if he has a current DME company he would like to stay with, that is fine, if he would like to change his DME, we can send the order to a new DME provider.  Please encourage ongoing full compliance and advised patient that we will need to follow-up in sleep clinic within 3 months after starting treatment with his new machine.  He can see one of our nurse practitioners as well.   Thanks,   Saima Athar, MD, PhD [facility] ([facility])     

## 2023-01-18 NOTE — Telephone Encounter (Signed)
Gave results already to pt

## 2023-01-27 DIAGNOSIS — G4733 Obstructive sleep apnea (adult) (pediatric): Secondary | ICD-10-CM | POA: Diagnosis not present

## 2023-02-06 IMAGING — CT CT CARDIAC CORONARY ARTERY CALCIUM SCORE
3 series · 14 of 20 positions shown, 16 images · non-contrast
Comparison: None

CLINICAL DATA: Hyperlipidemia in a 59-year-old white male.

EXAM:
CT CARDIAC CORONARY ARTERY CALCIUM SCORE
TECHNIQUE: Non-contrast imaging through the heart was performed using
prospective ECG gating. Image post processing was performed on an
independent workstation, allowing for quantitative analysis of the
heart and coronary arteries. Note that this exam targets the heart
and the chest was not imaged in its entirety.

[Series 2: calcium scoring 2.00 qr36 bestdiast 69% hrt calciu · axial · 0.45mm/px · z∈[+1606,+1690]mm · 4 of 70 slices shown]
[im 14/70  vessel]
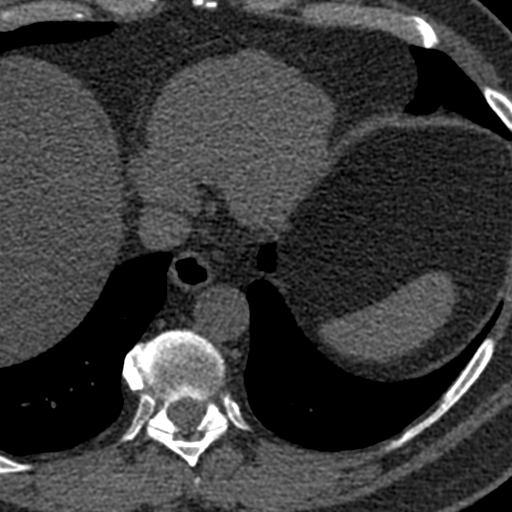
[im 28/70  vessel]
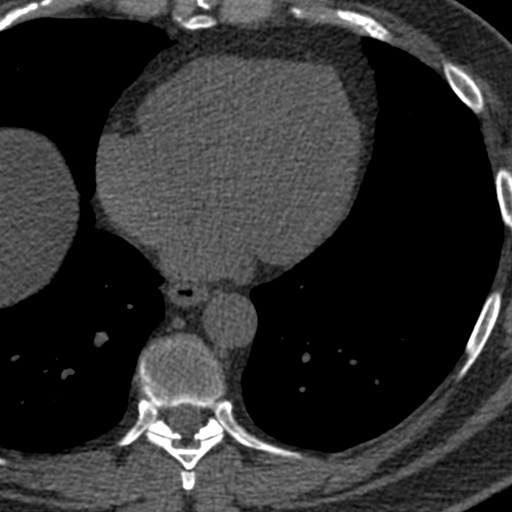
[im 42/70  vessel]
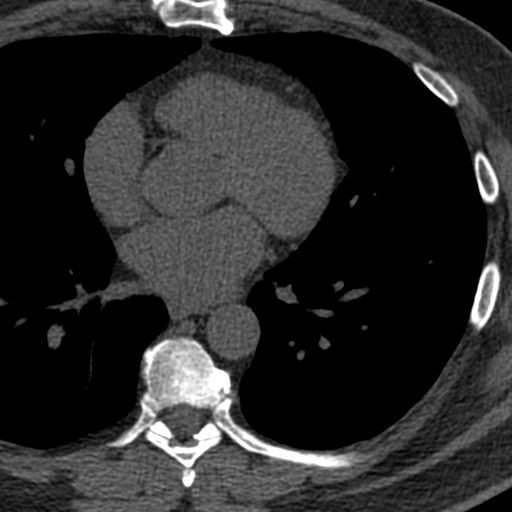
[im 56/70  vessel]
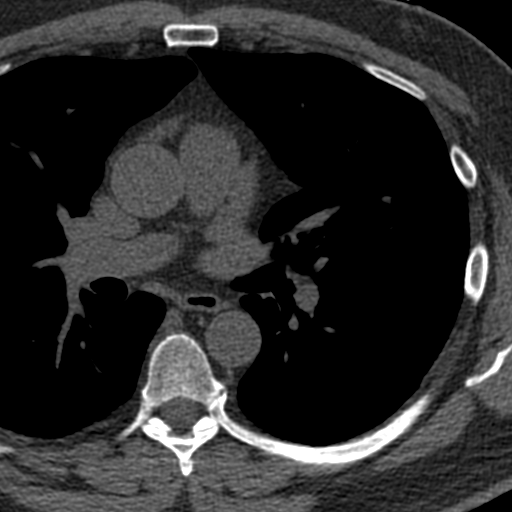

[Series 3: calcium scoring 2.00 br40 bestdiast 69% axial · axial · 0.65mm/px · z∈[+1602,+1694]mm · 5 of 70 slices shown, 7 images]
[im 12/70  vessel]
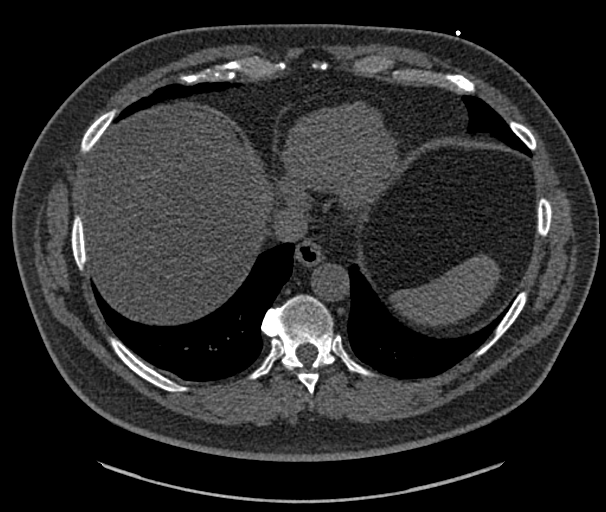
[im 12/70  lung]
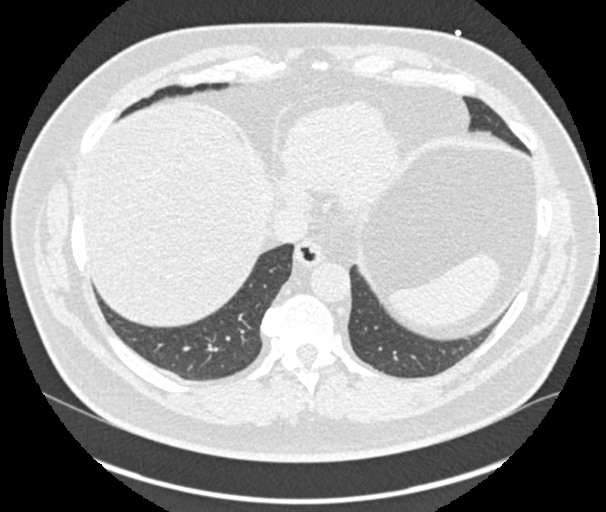
[im 24/70  vessel]
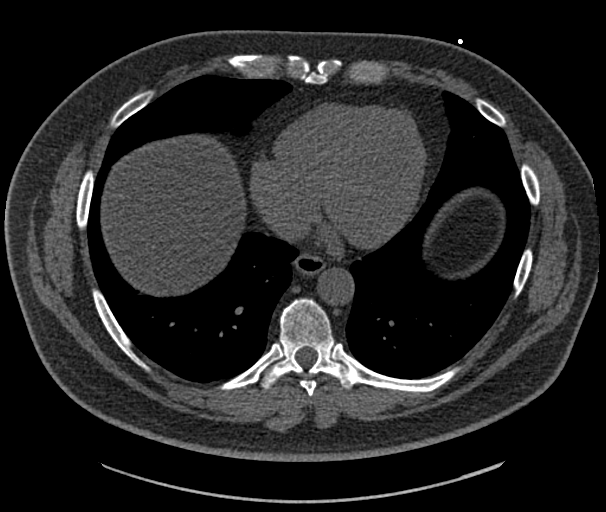
[im 35/70  vessel]
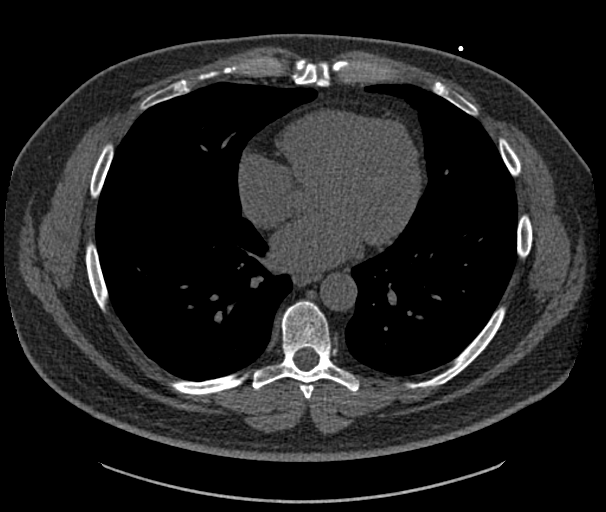
[im 47/70  vessel]
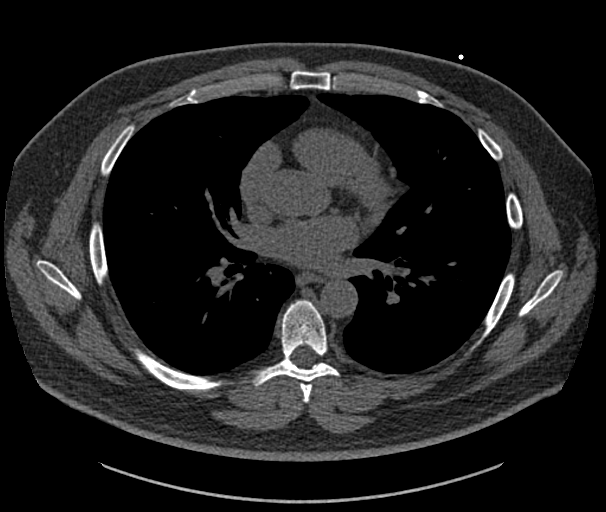
[im 58/70  vessel]
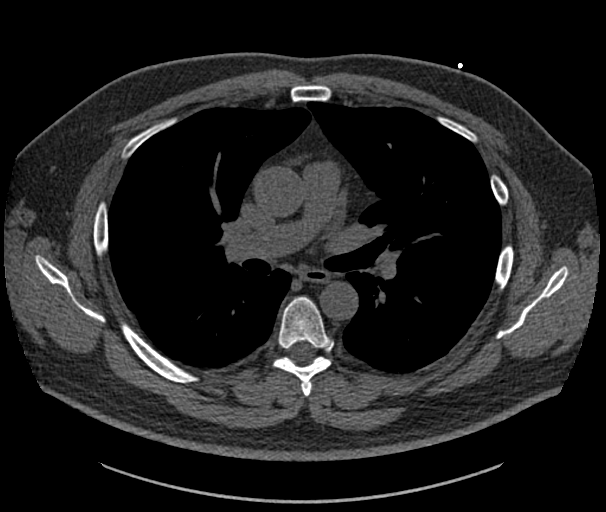
[im 58/70  lung]
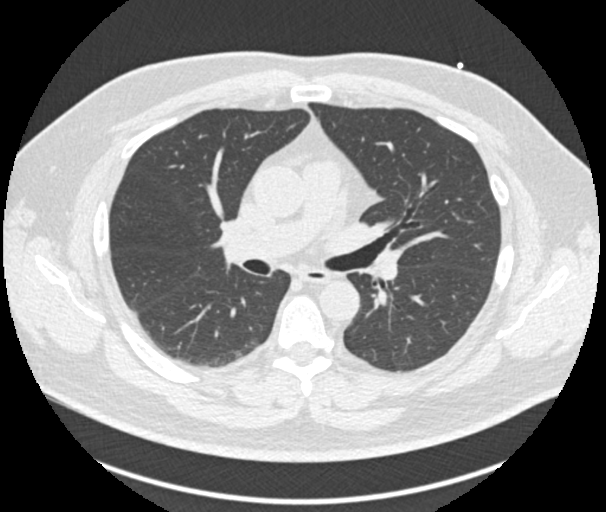

[Series 9: calcium scoring 2.00 br60 bestdiast 69% lungs · axial · 0.65mm/px · z∈[+1602,+1694]mm · 5 of 70 slices shown]
[im 12/70  vessel]
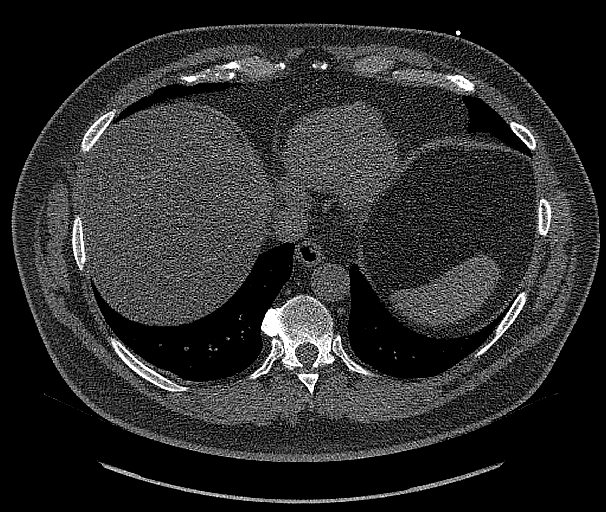
[im 24/70  vessel]
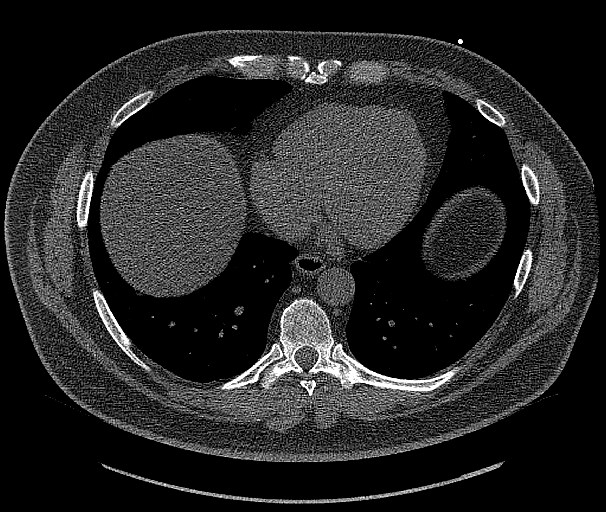
[im 35/70  vessel]
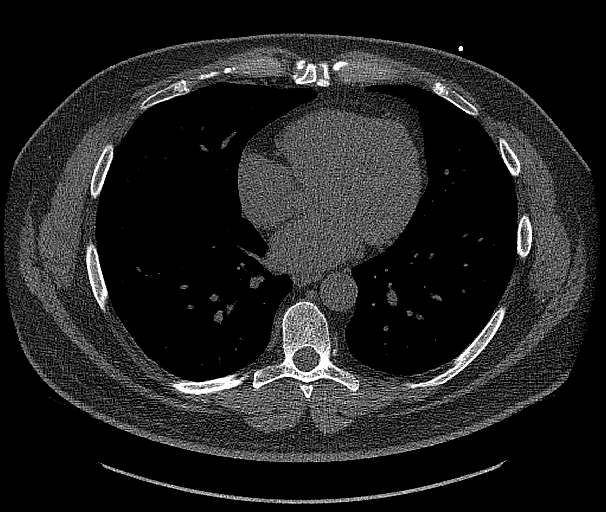
[im 47/70  vessel]
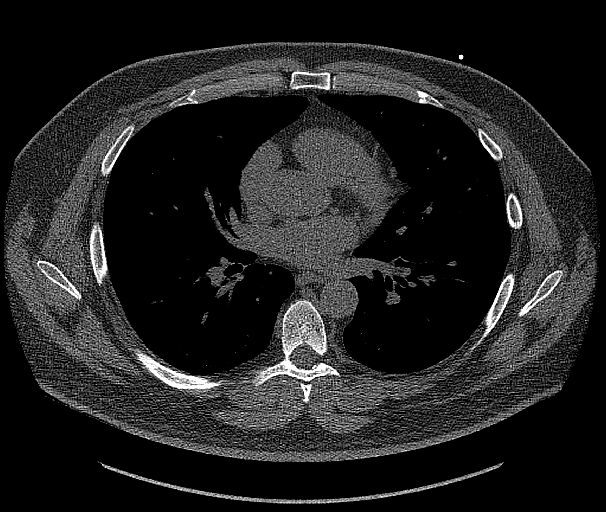
[im 58/70  vessel]
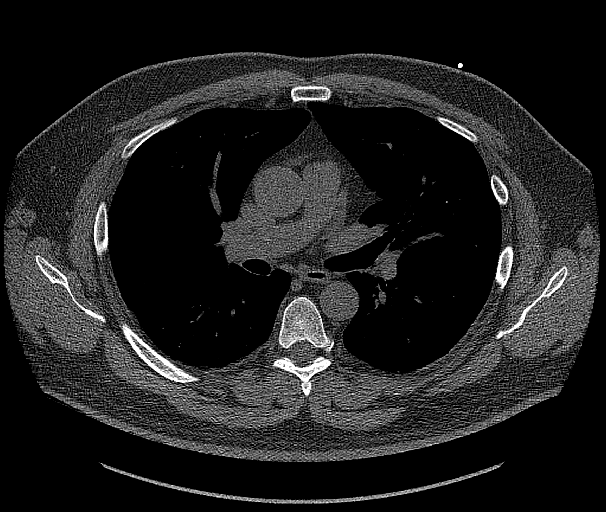

[14 of 20 positions shown; findings below may reference images not displayed]

FINDINGS: CORONARY CALCIUM SCORES:

Left Main:

LAD:

LCx:

RCA: 0

Total Agatston Score:

[HOSPITAL] percentile: 41

AORTA MEASUREMENTS:

Ascending Aorta: 32 mm

Descending Aorta: 25 mm

OTHER FINDINGS:

Cardiovascular: Mild dilation of the ascending thoracic aorta.
Normal caliber central pulmonary vasculature. Normal heart size. No
pericardial effusion. Limited assessment of cardiovascular
structures given lack of intravenous contrast.

Mediastinum/Nodes: No visualized adenopathy in the mediastinum.
Limited assessment on today's exam.

Lungs/Pleura: Airways are patent, visualized portions of the
airways. No consolidation or pleural effusion.

Small peribronchovascular nodule (image 27, series 9) 5 mm. Mild
bronchiectatic changes of the upper lobes partially visualized.
Cylindrical and mildly varicoid best seen on image 2 of series 9.
Mild lingular scarring.

Upper Abdomen: Marked hepatic steatosis, partially imaged. No acute
upper abdominal process.

Musculoskeletal: Spinal degenerative changes.
IMPRESSION: 1. Coronary artery calcium score of 8.6. This places the patient in
the 41st percentile for patients of this age, gender and
race/ethnicity.
2. Mild dilation of the ascending thoracic aorta. 3.2 cm. Recommend
annual imaging followup by CTA or MRA. This recommendation follows
0757 ACCF/AHA/AATS/ACR/ASA/SCA/EZMARAI/MAURALESS/MARY KAY/KIRIHARA Guidelines for the
Diagnosis and Management of Patients with Thoracic Aortic Disease.
Circulation.0757; 121: [id number]. Aortic aneurysm NOS ([id number].Z)
3. Small pulmonary nodule in the RIGHT middle lobe. Approximately 5
mm No follow-up needed if patient is low-risk. Non-contrast chest CT
can be considered in 12 months if patient is high-risk. This
recommendation follows the consensus statement: Guidelines for
Management of Incidental Pulmonary Nodules Detected on CT Images:
4. Mild bronchiectatic changes noted in the upper lobes and to a
lesser extent developing in the lower lobes. Correlate with any
history of chronic infection. Follow-up high-resolution chest CT may
be helpful particularly if there is a history of respiratory
symptoms.
5. Marked hepatic steatosis.

## 2023-02-24 DIAGNOSIS — G4733 Obstructive sleep apnea (adult) (pediatric): Secondary | ICD-10-CM | POA: Diagnosis not present

## 2023-02-26 DIAGNOSIS — G4733 Obstructive sleep apnea (adult) (pediatric): Secondary | ICD-10-CM | POA: Diagnosis not present

## 2023-03-27 DIAGNOSIS — G4733 Obstructive sleep apnea (adult) (pediatric): Secondary | ICD-10-CM | POA: Diagnosis not present

## 2023-03-29 DIAGNOSIS — G4733 Obstructive sleep apnea (adult) (pediatric): Secondary | ICD-10-CM | POA: Diagnosis not present

## 2023-04-25 DIAGNOSIS — M25522 Pain in left elbow: Secondary | ICD-10-CM | POA: Diagnosis not present

## 2023-04-25 DIAGNOSIS — R7301 Impaired fasting glucose: Secondary | ICD-10-CM | POA: Diagnosis not present

## 2023-04-25 DIAGNOSIS — Z23 Encounter for immunization: Secondary | ICD-10-CM | POA: Diagnosis not present

## 2023-04-27 ENCOUNTER — Ambulatory Visit: Payer: Federal, State, Local not specified - PPO | Admitting: Adult Health

## 2023-04-27 VITALS — BP 132/83 | HR 58 | Ht 71.0 in | Wt 212.0 lb

## 2023-04-27 DIAGNOSIS — G4733 Obstructive sleep apnea (adult) (pediatric): Secondary | ICD-10-CM

## 2023-04-27 NOTE — Patient Instructions (Signed)
Continue using CPAP nightly and greater than 4 hours each night °If your symptoms worsen or you develop new symptoms please let us know.  ° °

## 2023-04-29 DIAGNOSIS — G4733 Obstructive sleep apnea (adult) (pediatric): Secondary | ICD-10-CM | POA: Diagnosis not present

## 2023-05-05 DIAGNOSIS — Z85828 Personal history of other malignant neoplasm of skin: Secondary | ICD-10-CM | POA: Diagnosis not present

## 2023-05-05 DIAGNOSIS — L821 Other seborrheic keratosis: Secondary | ICD-10-CM | POA: Diagnosis not present

## 2023-05-05 DIAGNOSIS — L814 Other melanin hyperpigmentation: Secondary | ICD-10-CM | POA: Diagnosis not present

## 2023-05-05 DIAGNOSIS — L820 Inflamed seborrheic keratosis: Secondary | ICD-10-CM | POA: Diagnosis not present

## 2023-05-05 DIAGNOSIS — L570 Actinic keratosis: Secondary | ICD-10-CM | POA: Diagnosis not present

## 2023-05-05 DIAGNOSIS — D225 Melanocytic nevi of trunk: Secondary | ICD-10-CM | POA: Diagnosis not present

## 2023-07-29 DIAGNOSIS — G4733 Obstructive sleep apnea (adult) (pediatric): Secondary | ICD-10-CM | POA: Diagnosis not present

## 2023-08-29 DIAGNOSIS — G4733 Obstructive sleep apnea (adult) (pediatric): Secondary | ICD-10-CM | POA: Diagnosis not present

## 2023-09-29 DIAGNOSIS — G4733 Obstructive sleep apnea (adult) (pediatric): Secondary | ICD-10-CM | POA: Diagnosis not present

## 2023-11-04 DIAGNOSIS — D509 Iron deficiency anemia, unspecified: Secondary | ICD-10-CM | POA: Diagnosis not present

## 2023-11-04 DIAGNOSIS — Z1212 Encounter for screening for malignant neoplasm of rectum: Secondary | ICD-10-CM | POA: Diagnosis not present

## 2023-11-07 DIAGNOSIS — E785 Hyperlipidemia, unspecified: Secondary | ICD-10-CM | POA: Diagnosis not present

## 2023-11-07 DIAGNOSIS — Z125 Encounter for screening for malignant neoplasm of prostate: Secondary | ICD-10-CM | POA: Diagnosis not present

## 2023-11-11 DIAGNOSIS — I10 Essential (primary) hypertension: Secondary | ICD-10-CM | POA: Diagnosis not present

## 2023-11-11 DIAGNOSIS — Z0000 Encounter for general adult medical examination without abnormal findings: Secondary | ICD-10-CM | POA: Diagnosis not present

## 2023-11-11 DIAGNOSIS — R82998 Other abnormal findings in urine: Secondary | ICD-10-CM | POA: Diagnosis not present

## 2023-11-11 DIAGNOSIS — E118 Type 2 diabetes mellitus with unspecified complications: Secondary | ICD-10-CM | POA: Diagnosis not present

## 2023-11-11 DIAGNOSIS — Z1339 Encounter for screening examination for other mental health and behavioral disorders: Secondary | ICD-10-CM | POA: Diagnosis not present

## 2023-11-11 DIAGNOSIS — Z1331 Encounter for screening for depression: Secondary | ICD-10-CM | POA: Diagnosis not present

## 2023-12-21 DIAGNOSIS — I10 Essential (primary) hypertension: Secondary | ICD-10-CM | POA: Diagnosis not present

## 2023-12-21 DIAGNOSIS — E118 Type 2 diabetes mellitus with unspecified complications: Secondary | ICD-10-CM | POA: Diagnosis not present

## 2024-02-27 DIAGNOSIS — M79641 Pain in right hand: Secondary | ICD-10-CM | POA: Diagnosis not present

## 2024-02-27 DIAGNOSIS — I10 Essential (primary) hypertension: Secondary | ICD-10-CM | POA: Diagnosis not present

## 2024-03-30 DIAGNOSIS — E118 Type 2 diabetes mellitus with unspecified complications: Secondary | ICD-10-CM | POA: Diagnosis not present

## 2024-03-30 DIAGNOSIS — I10 Essential (primary) hypertension: Secondary | ICD-10-CM | POA: Diagnosis not present

## 2024-04-30 ENCOUNTER — Encounter: Payer: Self-pay | Admitting: *Deleted

## 2024-05-01 ENCOUNTER — Encounter: Payer: Federal, State, Local not specified - PPO | Admitting: Adult Health

## 2024-05-01 NOTE — Progress Notes (Deleted)
 PATIENT: Stephen Peck DOB: [birth year]  REASON FOR VISIT: follow up HISTORY FROM: patient PRIMARY NEUROLOGIST: Dr. Buck   No chief complaint on file.    HISTORY OF PRESENT ILLNESS: Today 05/01/24:  Stephen Peck is a 63 y.o. male with a history of OSA on CPAP. Returns today for follow-up.  CPAP download is below.  He denies any new issues.  Continues to find CPAP beneficial his download is below.       REVIEW OF SYSTEMS: Out of a complete 14 system review of symptoms, the patient complains only of the following symptoms, and all other reviewed systems are negative.  FSS ESS  ALLERGIES: No Known Allergies  HOME MEDICATIONS: Outpatient Medications Prior to Visit  Medication Sig Dispense Refill   Ascorbic Acid (VITAMIN C PO) Take by mouth.     Evolocumab (REPATHA) 140 MG/ML SOSY      omeprazole (PRILOSEC) 20 MG capsule Take 20 mg by mouth daily.     telmisartan-hydrochlorothiazide (MICARDIS HCT) 80-12.5 MG tablet Take 1 tablet by mouth daily.     No facility-administered medications prior to visit.    PAST MEDICAL HISTORY: Past Medical History:  Diagnosis Date   Abnormal liver function tests    Aortic dilatation (HCC)    ascending; 3.2 cm based on CT scan 09/2020   Atypical chest pain    Dyslipidemia    Elevated liver enzymes    External hemorrhoid    Fatigue    GERD (gastroesophageal reflux disease)    Hematuria, unspecified    Hypertension    Iron deficiency anemia    Lumbar back pain    Lumbar back pain    Peripheral neuropathy    Plantar fasciitis    R heel   Sleep apnea    cpap    PAST SURGICAL HISTORY: Past Surgical History:  Procedure Laterality Date   WISDOM TOOTH EXTRACTION      FAMILY HISTORY: Family History  Problem Relation Age of Onset   Stroke Mother    Hypertension Mother    Cancer Father        Lung   Heart disease Brother        Heart valve   Diabetes Brother    Cancer Maternal Grandmother    Stroke Paternal  Grandmother    Cancer Paternal Grandfather    Sleep apnea Neg Hx     SOCIAL HISTORY: Social History   Socioeconomic History   Marital status: Married    Spouse name: Not on file   Number of children: 2   Years of education: College   Highest education level: Not on file  Occupational History   Occupation: MBA [employer]  Tobacco Use   Smoking status: Never   Smokeless tobacco: Former  Substance and Sexual Activity   Alcohol  use: Yes    Alcohol /week: 7.0 standard drinks of alcohol     Types: 3 Glasses of wine, 3 Cans of beer, 1 Shots of liquor per week   Drug use: No   Sexual activity: Not on file  Other Topics Concern   Not on file  Social History Narrative   Drinks coffee daily    Social Drivers of Corporate investment banker Strain: Not on file  Food Insecurity: Not on file  Transportation Needs: Not on file  Physical Activity: Not on file  Stress: Not on file  Social Connections: Not on file  Intimate Partner Violence: Not on file      PHYSICAL EXAM  There were no vitals filed for this visit.  There is no height or weight on file to calculate BMI.  Generalized: Well developed, in no acute distress  Chest: Lungs clear to auscultation bilaterally  Neurological examination  Mentation: Alert oriented to time, place, history taking. Follows all commands speech and language fluent Cranial nerve II-XII: Extraocular movements were full, visual field were full on confrontational test Head turning and shoulder shrug  were normal and symmetric. Motor: The motor testing reveals 5 over 5 strength of all 4 extremities. Good symmetric motor tone is noted throughout.  Sensory: Sensory testing is intact to soft touch on all 4 extremities. No evidence of extinction is noted.  Gait and station: Gait is normal.    DIAGNOSTIC DATA (LABS, IMAGING, TESTING) - I reviewed patient records, labs, notes, testing and imaging myself where available.      ASSESSMENT AND PLAN 63 y.o.  year old male  has a past medical history of Abnormal liver function tests, Aortic dilatation (HCC), Atypical chest pain, Dyslipidemia, Elevated liver enzymes, External hemorrhoid, Fatigue, GERD (gastroesophageal reflux disease), Hematuria, unspecified, Hypertension, Iron deficiency anemia, Lumbar back pain, Lumbar back pain, Peripheral neuropathy, Plantar fasciitis, and Sleep apnea. here with:  OSA on CPAP  - CPAP compliance excellent - Good treatment of AHI  - Encourage patient to use CPAP nightly and > 4 hours each night - F/U in 1 year or sooner if needed    Duwaine Russell, MSN, NP-C 05/01/2024, 1:38 PM [facility] [address] [place], [place] [address] [phone]

## 2024-05-01 NOTE — Progress Notes (Signed)
 This encounter was created in error - please disregard.

## 2024-05-07 ENCOUNTER — Ambulatory Visit (INDEPENDENT_AMBULATORY_CARE_PROVIDER_SITE_OTHER): Admitting: Adult Health

## 2024-05-07 ENCOUNTER — Encounter: Payer: Self-pay | Admitting: Adult Health

## 2024-05-07 VITALS — BP 124/92 | HR 79 | Ht 71.0 in | Wt 210.0 lb

## 2024-05-07 DIAGNOSIS — G4733 Obstructive sleep apnea (adult) (pediatric): Secondary | ICD-10-CM

## 2024-05-07 NOTE — Progress Notes (Signed)
 PATIENT: Stephen Peck DOB: 04/10/1961  REASON FOR VISIT: follow up HISTORY FROM: patient PRIMARY NEUROLOGIST: Dr. Buck   Chief Complaint  Patient presents with   Follow-up    Pt in 18 alone Pt here for cpap f/u  Pt wants to discuss reservoir on pap machine       HISTORY OF PRESENT ILLNESS: Today 05/07/24:  Stephen Peck is a 63 y.o. male with a history of obstructive sleep apnea on CPAP. Returns today for follow-up.  He reports that CPAP is working well for him.  He currently has the DreamWear nasal mask.  He states that he does not always use the water at night.  Overall he feels that CPAP is beneficial.  He does report that this year he was diagnosed with type 2 diabetes.  His download is below.     04/27/23: Stephen Peck is a 64 y.o. male with a history of OSA on CPAP. Returns today for follow-up.  CPAP download is below.  He denies any new issues.  Continues to find CPAP beneficial his download is below.       REVIEW OF SYSTEMS: Out of a complete 14 system review of symptoms, the patient complains only of the following symptoms, and all other reviewed systems are negative.  FSS ESS  ALLERGIES: No Known Allergies  HOME MEDICATIONS: Outpatient Medications Prior to Visit  Medication Sig Dispense Refill   Ascorbic Acid (VITAMIN C PO) Take by mouth.     Evolocumab (REPATHA) 140 MG/ML SOSY      omeprazole (PRILOSEC) 20 MG capsule Take 20 mg by mouth daily.     telmisartan-hydrochlorothiazide (MICARDIS HCT) 80-12.5 MG tablet Take 1 tablet by mouth daily.     OZEMPIC, 1 MG/DOSE, 4 MG/3ML SOPN Inject 1 mg into the skin once a week.     No facility-administered medications prior to visit.    PAST MEDICAL HISTORY: Past Medical History:  Diagnosis Date   Abnormal liver function tests    Aortic dilatation (HCC)    ascending; 3.2 cm based on CT scan 09/2020   Atypical chest pain    Diabetes (HCC)    Dyslipidemia    Elevated liver enzymes     External hemorrhoid    Fatigue    GERD (gastroesophageal reflux disease)    Hematuria, unspecified    Hypertension    Iron deficiency anemia    Lumbar back pain    Lumbar back pain    Peripheral neuropathy    Plantar fasciitis    R heel   Sleep apnea    cpap    PAST SURGICAL HISTORY: Past Surgical History:  Procedure Laterality Date   WISDOM TOOTH EXTRACTION      FAMILY HISTORY: Family History  Problem Relation Age of Onset   Stroke Mother    Hypertension Mother    Cancer Father        Lung   Heart disease Brother        Heart valve   Diabetes Brother    Cancer Maternal Grandmother    Stroke Paternal Grandmother    Cancer Paternal Grandfather    Sleep apnea Neg Hx     SOCIAL HISTORY: Social History   Socioeconomic History   Marital status: Married    Spouse name: Not on file   Number of children: 2   Years of education: College   Highest education level: Not on file  Occupational History   Occupation: MBA [employer]  Tobacco Use  Smoking status: Never   Smokeless tobacco: Former  Building services engineer status: Never Used  Substance and Sexual Activity   Alcohol  use: Yes    Alcohol /week: 7.0 standard drinks of alcohol     Types: 3 Glasses of wine, 3 Cans of beer, 1 Shots of liquor per week   Drug use: No   Sexual activity: Not on file  Other Topics Concern   Not on file  Social History Narrative   Drinks coffee daily    Pt works    Pt lives with family    Social Drivers of Corporate investment banker Strain: Not on file  Food Insecurity: Not on file  Transportation Needs: Not on file  Physical Activity: Not on file  Stress: Not on file  Social Connections: Not on file  Intimate Partner Violence: Not on file      PHYSICAL EXAM  Vitals:   05/07/24 0901 05/07/24 0924  BP: (!) 143/93 (!) 124/92  Pulse: 79   Weight: 210 lb (95.3 kg)   Height: 5' 11 (1.803 m)    Body mass index is 29.29 kg/m.  Generalized: Well developed, in no acute distress   Chest: Lungs clear to auscultation bilaterally  Neurological examination  Mentation: Alert oriented to time, place, history taking. Follows all commands speech and language fluent Cranial nerve II-XII: Facial symmetry noted  DIAGNOSTIC DATA (LABS, IMAGING, TESTING) - I reviewed patient records, labs, notes, testing and imaging myself where available.      ASSESSMENT AND PLAN 63 y.o. year old male  has a past medical history of Abnormal liver function tests, Aortic dilatation (HCC), Atypical chest pain, Diabetes (HCC), Dyslipidemia, Elevated liver enzymes, External hemorrhoid, Fatigue, GERD (gastroesophageal reflux disease), Hematuria, unspecified, Hypertension, Iron deficiency anemia, Lumbar back pain, Lumbar back pain, Peripheral neuropathy, Plantar fasciitis, and Sleep apnea. here with:  OSA on CPAP  - CPAP compliance excellent - Good treatment of AHI  - Encourage patient to use CPAP nightly and > 4 hours each night - F/U in 1 year or sooner if needed    Duwaine Russell, MSN, NP-C 05/07/2024, 9:10 AM [facility] [address], [address] [place], [place] [address] [phone]  The patient's condition requires frequent monitoring and adjustments in the treatment plan, reflecting the ongoing complexity of care.  This provider is the continuing focal point for all needed services for this condition.

## 2024-05-07 NOTE — Patient Instructions (Signed)
 Continue using CPAP nightly and greater than 4 hours each night If your symptoms worsen or you develop new symptoms please let us  know.

## 2024-05-08 DIAGNOSIS — L814 Other melanin hyperpigmentation: Secondary | ICD-10-CM | POA: Diagnosis not present

## 2024-05-08 DIAGNOSIS — L570 Actinic keratosis: Secondary | ICD-10-CM | POA: Diagnosis not present

## 2024-05-08 DIAGNOSIS — L821 Other seborrheic keratosis: Secondary | ICD-10-CM | POA: Diagnosis not present

## 2024-05-08 DIAGNOSIS — D1801 Hemangioma of skin and subcutaneous tissue: Secondary | ICD-10-CM | POA: Diagnosis not present

## 2024-05-08 DIAGNOSIS — Z85828 Personal history of other malignant neoplasm of skin: Secondary | ICD-10-CM | POA: Diagnosis not present

## 2024-05-30 DIAGNOSIS — R197 Diarrhea, unspecified: Secondary | ICD-10-CM | POA: Diagnosis not present

## 2024-05-30 DIAGNOSIS — K529 Noninfective gastroenteritis and colitis, unspecified: Secondary | ICD-10-CM | POA: Diagnosis not present

## 2024-06-20 ENCOUNTER — Ambulatory Visit
Admission: RE | Admit: 2024-06-20 | Discharge: 2024-06-20 | Disposition: A | Discharge status: Home / Self Care | Source: Ambulatory Visit | Attending: Internal Medicine | Admitting: Internal Medicine

## 2024-06-20 ENCOUNTER — Other Ambulatory Visit: Payer: Self-pay | Admitting: Internal Medicine

## 2024-06-20 DIAGNOSIS — R140 Abdominal distension (gaseous): Secondary | ICD-10-CM

## 2024-06-20 DIAGNOSIS — R197 Diarrhea, unspecified: Secondary | ICD-10-CM

## 2024-06-26 DIAGNOSIS — K625 Hemorrhage of anus and rectum: Secondary | ICD-10-CM | POA: Diagnosis not present

## 2024-06-26 DIAGNOSIS — K760 Fatty (change of) liver, not elsewhere classified: Secondary | ICD-10-CM | POA: Diagnosis not present

## 2024-06-26 DIAGNOSIS — K219 Gastro-esophageal reflux disease without esophagitis: Secondary | ICD-10-CM | POA: Diagnosis not present

## 2024-08-06 DIAGNOSIS — E118 Type 2 diabetes mellitus with unspecified complications: Secondary | ICD-10-CM | POA: Diagnosis not present

## 2024-08-06 DIAGNOSIS — I10 Essential (primary) hypertension: Secondary | ICD-10-CM | POA: Diagnosis not present

## 2024-08-06 DIAGNOSIS — Z23 Encounter for immunization: Secondary | ICD-10-CM | POA: Diagnosis not present

## 2025-05-07 ENCOUNTER — Telehealth: Admitting: Adult Health
